# Patient Record
Sex: Male | Born: 1975 | Race: Black or African American | Hispanic: No | Marital: Single | Smoking: Current every day smoker
Health system: Southern US, Community
[De-identification: ages and names within clinical notes are randomized; demographics above are authoritative.]

## PROBLEM LIST (undated history)

## (undated) ENCOUNTER — Emergency Department (HOSPITAL_COMMUNITY): Payer: Self-pay

---

## 1998-04-15 ENCOUNTER — Emergency Department (HOSPITAL_COMMUNITY): Admission: EM | Admit: 1998-04-15 | Discharge: 1998-04-15 | Payer: Self-pay | Admitting: Emergency Medicine

## 1998-04-15 ENCOUNTER — Encounter: Payer: Self-pay | Admitting: Emergency Medicine

## 1998-09-08 ENCOUNTER — Encounter: Payer: Self-pay | Admitting: Emergency Medicine

## 1998-09-08 ENCOUNTER — Emergency Department (HOSPITAL_COMMUNITY): Admission: EM | Admit: 1998-09-08 | Discharge: 1998-09-08 | Payer: Self-pay | Admitting: Emergency Medicine

## 2002-05-14 ENCOUNTER — Emergency Department (HOSPITAL_COMMUNITY): Admission: EM | Admit: 2002-05-14 | Discharge: 2002-05-14 | Payer: Self-pay | Admitting: Emergency Medicine

## 2002-05-14 ENCOUNTER — Encounter: Payer: Self-pay | Admitting: Emergency Medicine

## 2003-11-05 ENCOUNTER — Emergency Department (HOSPITAL_COMMUNITY): Admission: EM | Admit: 2003-11-05 | Discharge: 2003-11-05 | Payer: Self-pay | Admitting: Emergency Medicine

## 2005-08-11 ENCOUNTER — Emergency Department (HOSPITAL_COMMUNITY): Admission: EM | Admit: 2005-08-11 | Discharge: 2005-08-11 | Payer: Self-pay | Admitting: Emergency Medicine

## 2008-04-20 ENCOUNTER — Emergency Department (HOSPITAL_COMMUNITY): Admission: EM | Admit: 2008-04-20 | Discharge: 2008-04-20 | Payer: Self-pay | Admitting: Emergency Medicine

## 2014-01-10 ENCOUNTER — Encounter (HOSPITAL_COMMUNITY): Payer: Self-pay | Admitting: Emergency Medicine

## 2014-01-10 ENCOUNTER — Emergency Department (HOSPITAL_COMMUNITY)
Admission: EM | Admit: 2014-01-10 | Discharge: 2014-01-10 | Disposition: A | Discharge status: Home / Self Care | Payer: BC Managed Care – PPO | Attending: Emergency Medicine | Admitting: Emergency Medicine

## 2014-01-10 ENCOUNTER — Emergency Department (HOSPITAL_COMMUNITY): Payer: BC Managed Care – PPO

## 2014-01-10 DIAGNOSIS — F17200 Nicotine dependence, unspecified, uncomplicated: Secondary | ICD-10-CM | POA: Insufficient documentation

## 2014-01-10 DIAGNOSIS — IMO0002 Reserved for concepts with insufficient information to code with codable children: Secondary | ICD-10-CM

## 2014-01-10 DIAGNOSIS — L98499 Non-pressure chronic ulcer of skin of other sites with unspecified severity: Secondary | ICD-10-CM | POA: Insufficient documentation

## 2014-01-10 MED ORDER — CEPHALEXIN 250 MG PO CAPS: 250.0000 mg | ORAL_CAPSULE | Freq: Four times a day (QID) | ORAL | Status: AC

## 2014-01-10 MED ORDER — CEPHALEXIN 250 MG PO CAPS
250.0000 mg | ORAL_CAPSULE | Freq: Once | ORAL | Status: AC
  Administered 2014-01-10: 250 mg via ORAL
  Filled 2014-01-10: qty 1

## 2014-01-10 NOTE — ED Provider Notes (Signed)
CSN: [id number]     Arrival date & time 01/10/14  2023 History  This chart was scribed for non-physician practitioner Arman FilterGail K Nikitta Sobiech, NP, working with Juliet RudeNathan R. Rubin PayorPickering, MD, by Yevette EdwardsAngela Bracken, ED Scribe. This patient was seen in room WTR6/WTR6 and the patient's care was started at 9:09 PM.   First MD Initiated Contact with Patient 01/10/14 2050     Chief Complaint  Patient presents with  . Finger Injury    HPI HPI Comments: Louretta ShortenClarence B Rosenbloom is a 38 y.o. male who presents to the Emergency Department complaining of chronic swelling to his left index finger which has persisted for approximately a year. The pt states he initially injured the finger a year ago when he accidentally punctured the finger. There was swelling associated with the initial injury three weeks later, but the pt did not seek treatment. Then, six months ago, the pt punctured the affected finger with a needle to release the pressure, and he reports drainage occurred, but he denies any bleeding from the site. The finger has persisted to experience swelling and irritation.   History reviewed. No pertinent past medical history. History reviewed. No pertinent past surgical history. History reviewed. No pertinent family history. History  Substance Use Topics  . Smoking status: Current Every Day Smoker -- 0.50 packs/day    Types: Cigarettes  . Smokeless tobacco: Not on file  . Alcohol Use: Yes    Review of Systems  Musculoskeletal: Positive for myalgias.  Skin: Positive for color change.  All other systems reviewed and are negative.   Allergies  Shellfish allergy  Home Medications   Prior to Admission medications   Not on File   There were no vitals taken for this visit. Physical Exam  Nursing note and vitals reviewed. Constitutional: He is oriented to person, place, and time. He appears well-developed and well-nourished. No distress.  HENT:  Head: Normocephalic and atraumatic.  Eyes: EOM are normal.  Neck:  Neck supple. No tracheal deviation present.  Cardiovascular: Normal rate.   Pulmonary/Chest: Effort normal. No respiratory distress.  Musculoskeletal: Normal range of motion.  Neurological: He is alert and oriented to person, place, and time.  Skin: Skin is warm and dry.  2 cm by 1 cm area on palmar surface of left index finger with a distal ulcerated open area with purulent discharge.   Psychiatric: He has a normal mood and affect. His behavior is normal.    ED Course  Procedures (including critical care time)  DIAGNOSTIC STUDIES:    COORDINATION OF CARE:  9:12 PM- Discussed treatment plan with patient, and the patient agreed to the plan. The plan includes imaging, an antibiotic, and incision and drainage.   10:11 PM- INCISION AND DRAINAGE PROCEDURE NOTE: Patient identification was confirmed and verbal consent was obtained. This procedure was performed by Arman FilterGail K Willamina Grieshop, NP,at 10:11 PM. Site: left index finger Sterile procedures observed  Needle size: 25 gauge Anesthetic used (type and amt): 2% lidocaine without epi Blade size: 11 Drainage: minimal when first squeezed, but no real drainage once opened; tissue inflammation  Complexity: Complex Site anesthetized, incision made over site, wound drained and explored loculations, rinsed with copious amounts of normal saline, wound packed with sterile gauze, covered with dry, sterile dressing.  Pt tolerated procedure well without complications.  Instructions for care discussed verbally and pt provided with additional written instructions for homecare and f/u.  10:28 PM- LACERATION REPAIR PROCEDURE NOTE The patient's identification was confirmed and consent was obtained. This procedure  was performed by Arman FilterGail K Alayssa Flinchum, NP, at 10:28 PM. Site: left index finger Sterile procedures observed Anesthetic used (type and amt): 2% lidocaine without epi Suture type/size: 3.0 Prolene Length: 1/2 cm, round  # of Sutures: 2 Technique:  Cross-hatch Complexity: complex Antibx ointment applied Site anesthetized, irrigated with NS, explored without evidence of foreign body, wound well approximated, site covered with dry, sterile dressing.  Patient tolerated procedure well without complications. Instructions for care discussed verbally and patient provided with additional written instructions for homecare and f/u.   Labs Review Labs Reviewed - No data to display  Imaging Review Dg Finger Index Left  01/10/2014   CLINICAL DATA:  Chronic injury, non healing.  EXAM: LEFT INDEX FINGER 2+V  COMPARISON:  No comparisons  FINDINGS: There is no evidence of fracture or dislocation. There is no evidence of arthropathy or other focal bone abnormality. Focal soft tissue swelling within the volar palmar soft tissues of the second middle phalanx soft tissues with subcutaneous gas, no radiopaque foreign bodies.  IMPRESSION: Focal second middle phalanx soft tissue swelling with subcutaneous gas, no radiopaque foreign bodies or radiographic findings of osteomyelitis.   Electronically Signed   By: Awilda Metroourtnay  Bloomer   On: 01/10/2014 21:55     EKG Interpretation None      MDM   Procedure very little purulent material removed .  The suture line revealed that there was chronic tissue inflammation versus abscess .  The core of the injury was removed as this was an open area, allowing air or gas to enter the finger this was removed in its entirety, sutured.Due to the  complexity and nature of the wound.  I will start patient on Keflex for 10, days.  I will also have the patient.  Followup with hand surgery sutures should be removed in 10 days Final diagnoses:  Inflammation of finger or toe          Arman FilterGail K Tedra Coppernoll, NP 01/10/14 2244

## 2014-01-10 NOTE — Discharge Instructions (Signed)
Fingertip Infection When an infection is around the nail, it is called a paronychia. When it appears over the tip of the finger, it is called a felon. These infections are due to minor injuries or cracks in the skin. If they are not treated properly, they can lead to bone infection and permanent damage to the fingernail. Incision and drainage is necessary if a pus pocket (an abscess) has formed. Antibiotics and pain medicine may also be needed. Keep your hand elevated for the next 2-3 days to reduce swelling and pain. If a pack was placed in the abscess, it should be removed in 1-2 days by your caregiver. Soak the finger in warm water for 20 minutes 4 times daily to help promote drainage. Keep the hands as dry as possible. Wear protective gloves with cotton liners. See your caregiver for follow-up care as recommended.  HOME CARE INSTRUCTIONS   Keep wound clean, dry and dressed as suggested by your caregiver.  Soak in warm salt water for fifteen minutes, four times per day for bacterial infections.  Your caregiver will prescribe an antibiotic if a bacterial infection is suspected. Take antibiotics as directed and finish the prescription, even if the problem appears to be improving before the medicine is gone.  Only take over-the-counter or prescription medicines for pain, discomfort, or fever as directed by your caregiver. SEEK IMMEDIATE MEDICAL CARE IF:  There is redness, swelling, or increasing pain in the wound.  Pus or any other unusual drainage is coming from the wound.  An unexplained oral temperature above 102 F (38.9 C) develops.  You notice a foul smell coming from the wound or dressing. MAKE SURE YOU:   Understand these instructions.  Monitor your condition.  Contact your caregiver if you are getting worse or not improving. Document Released: 09/20/2004 Document Revised: 11/05/2011 Document Reviewed: 09/16/2008 [facility] Patient Information 2014 [place] [place]. The "hard  skin" that was keeping the wound open has been removed and sutures  Please take the antibiotic until completed  You have also been referred to the hand surgeon for follow up  The sutures should be removed in 10 days

## 2014-01-10 NOTE — ED Notes (Signed)
Pt reports with c/o of injury to L index finger. Pt states that he had a previous injury about six months ago where something punctured his finger. No bleeding or deformity noted to finger. Pt alert and ambulatory to waiting room.

## 2014-01-11 NOTE — ED Provider Notes (Signed)
Medical screening examination/treatment/procedure(s) were performed by non-physician practitioner and as supervising physician I was immediately available for consultation/collaboration.   EKG Interpretation None       Malicia Blasdel R. Ray Gervasi, MD 01/11/14 0010 

## 2015-05-02 ENCOUNTER — Emergency Department (HOSPITAL_COMMUNITY): Payer: BLUE CROSS/BLUE SHIELD

## 2015-05-02 ENCOUNTER — Emergency Department (HOSPITAL_COMMUNITY)
Admission: EM | Admit: 2015-05-02 | Discharge: 2015-05-02 | Disposition: A | Discharge status: Home / Self Care | Payer: BLUE CROSS/BLUE SHIELD | Attending: Emergency Medicine | Admitting: Emergency Medicine

## 2015-05-02 ENCOUNTER — Encounter (HOSPITAL_COMMUNITY): Payer: Self-pay | Admitting: Vascular Surgery

## 2015-05-02 DIAGNOSIS — Z720 Tobacco use: Secondary | ICD-10-CM | POA: Insufficient documentation

## 2015-05-02 DIAGNOSIS — S8991XA Unspecified injury of right lower leg, initial encounter: Secondary | ICD-10-CM | POA: Insufficient documentation

## 2015-05-02 DIAGNOSIS — Y998 Other external cause status: Secondary | ICD-10-CM | POA: Insufficient documentation

## 2015-05-02 DIAGNOSIS — W1842XA Slipping, tripping and stumbling without falling due to stepping into hole or opening, initial encounter: Secondary | ICD-10-CM | POA: Diagnosis not present

## 2015-05-02 DIAGNOSIS — Y9389 Activity, other specified: Secondary | ICD-10-CM | POA: Insufficient documentation

## 2015-05-02 DIAGNOSIS — M25561 Pain in right knee: Secondary | ICD-10-CM

## 2015-05-02 DIAGNOSIS — Z792 Long term (current) use of antibiotics: Secondary | ICD-10-CM | POA: Insufficient documentation

## 2015-05-02 DIAGNOSIS — Y9289 Other specified places as the place of occurrence of the external cause: Secondary | ICD-10-CM | POA: Diagnosis not present

## 2015-05-02 MED ORDER — NAPROXEN 250 MG PO TABS
500.0000 mg | ORAL_TABLET | Freq: Once | ORAL | Status: AC
  Administered 2015-05-02: 500 mg via ORAL
  Filled 2015-05-02: qty 2

## 2015-05-02 MED ORDER — NAPROXEN 500 MG PO TABS: 500.0000 mg | ORAL_TABLET | Freq: Two times a day (BID) | ORAL | Status: AC

## 2015-05-02 NOTE — Discharge Instructions (Signed)
Knee Pain Follow up with orthopedics if you continue to have knee pain. Rest. Ice. Compress with Ace bandage. Elevate the leg. Take naproxen for pain. The knee is the complex joint between your thigh and your lower leg. It is made up of bones, tendons, ligaments, and cartilage. The bones that make up the knee are:  The femur in the thigh.  The tibia and fibula in the lower leg.  The patella or kneecap riding in the groove on the lower femur. CAUSES  Knee pain is a common complaint with many causes. A few of these causes are:  Injury, such as:  A ruptured ligament or tendon injury.  Torn cartilage.  Medical conditions, such as:  Gout  Arthritis  Infections  Overuse, over training, or overdoing a physical activity. Knee pain can be minor or severe. Knee pain can accompany debilitating injury. Minor knee problems often respond well to self-care measures or get well on their own. More serious injuries may need medical intervention or even surgery. SYMPTOMS The knee is complex. Symptoms of knee problems can vary widely. Some of the problems are:  Pain with movement and weight bearing.  Swelling and tenderness.  Buckling of the knee.  Inability to straighten or extend your knee.  Your knee locks and you cannot straighten it.  Warmth and redness with pain and fever.  Deformity or dislocation of the kneecap. DIAGNOSIS  Determining what is wrong may be very straight forward such as when there is an injury. It can also be challenging because of the complexity of the knee. Tests to make a diagnosis may include:  Your caregiver taking a history and doing a physical exam.  Routine X-rays can be used to rule out other problems. X-rays will not reveal a cartilage tear. Some injuries of the knee can be diagnosed by:  Arthroscopy a surgical technique by which a small video camera is inserted through tiny incisions on the sides of the knee. This procedure is used to examine and  repair internal knee joint problems. Tiny instruments can be used during arthroscopy to repair the torn knee cartilage (meniscus).  Arthrography is a radiology technique. A contrast liquid is directly injected into the knee joint. Internal structures of the knee joint then become visible on X-ray film.  An MRI scan is a non X-ray radiology procedure in which magnetic fields and a computer produce two- or three-dimensional images of the inside of the knee. Cartilage tears are often visible using an MRI scanner. MRI scans have largely replaced arthrography in diagnosing cartilage tears of the knee.  Blood work.  Examination of the fluid that helps to lubricate the knee joint (synovial fluid). This is done by taking a sample out using a needle and a syringe. TREATMENT The treatment of knee problems depends on the cause. Some of these treatments are:  Depending on the injury, proper casting, splinting, surgery, or physical therapy care will be needed.  Give yourself adequate recovery time. Do not overuse your joints. If you begin to get sore during workout routines, back off. Slow down or do fewer repetitions.  For repetitive activities such as cycling or running, maintain your strength and nutrition.  Alternate muscle groups. For example, if you are a weight lifter, work the upper body on one day and the lower body the next.  Either tight or weak muscles do not give the proper support for your knee. Tight or weak muscles do not absorb the stress placed on the knee joint. Keep  the muscles surrounding the knee strong.  Take care of mechanical problems.  If you have flat feet, orthotics or special shoes may help. See your caregiver if you need help.  Arch supports, sometimes with wedges on the inner or outer aspect of the heel, can help. These can shift pressure away from the side of the knee most bothered by osteoarthritis.  A brace called an "unloader" brace also may be used to help ease the  pressure on the most arthritic side of the knee.  If your caregiver has prescribed crutches, braces, wraps or ice, use as directed. The acronym for this is PRICE. This means protection, rest, ice, compression, and elevation.  Nonsteroidal anti-inflammatory drugs (NSAIDs), can help relieve pain. But if taken immediately after an injury, they may actually increase swelling. Take NSAIDs with food in your stomach. Stop them if you develop stomach problems. Do not take these if you have a history of ulcers, stomach pain, or bleeding from the bowel. Do not take without your caregiver's approval if you have problems with fluid retention, heart failure, or kidney problems.  For ongoing knee problems, physical therapy may be helpful.  Glucosamine and chondroitin are over-the-counter dietary supplements. Both may help relieve the pain of osteoarthritis in the knee. These medicines are different from the usual anti-inflammatory drugs. Glucosamine may decrease the rate of cartilage destruction.  Injections of a corticosteroid drug into your knee joint may help reduce the symptoms of an arthritis flare-up. They may provide pain relief that lasts a few months. You may have to wait a few months between injections. The injections do have a small increased risk of infection, water retention, and elevated blood sugar levels.  Hyaluronic acid injected into damaged joints may ease pain and provide lubrication. These injections may work by reducing inflammation. A series of shots may give relief for as long as 6 months.  Topical painkillers. Applying certain ointments to your skin may help relieve the pain and stiffness of osteoarthritis. Ask your pharmacist for suggestions. Many over the-counter products are approved for temporary relief of arthritis pain.  In some countries, doctors often prescribe topical NSAIDs for relief of chronic conditions such as arthritis and tendinitis. A review of treatment with NSAID creams  found that they worked as well as oral medications but without the serious side effects. PREVENTION  Maintain a healthy weight. Extra pounds put more strain on your joints.  Get strong, stay limber. Weak muscles are a common cause of knee injuries. Stretching is important. Include flexibility exercises in your workouts.  Be smart about exercise. If you have osteoarthritis, chronic knee pain or recurring injuries, you may need to change the way you exercise. This does not mean you have to stop being active. If your knees ache after jogging or playing basketball, consider switching to swimming, water aerobics, or other low-impact activities, at least for a few days a week. Sometimes limiting high-impact activities will provide relief.  Make sure your shoes fit well. Choose footwear that is right for your sport.  Protect your knees. Use the proper gear for knee-sensitive activities. Use kneepads when playing volleyball or laying carpet. Buckle your seat belt every time you drive. Most shattered kneecaps occur in car accidents.  Rest when you are tired. SEEK MEDICAL CARE IF:  You have knee pain that is continual and does not seem to be getting better.  SEEK IMMEDIATE MEDICAL CARE IF:  Your knee joint feels hot to the touch and you have a high  fever. MAKE SURE YOU:   Understand these instructions.  Will watch your condition.  Will get help right away if you are not doing well or get worse. Document Released: 06/10/2007 Document Revised: 11/05/2011 Document Reviewed: 06/10/2007 [facility] Patient Information 2015 [place], [place]. This information is not intended to replace advice given to you by your health care provider. Make sure you discuss any questions you have with your health care provider.

## 2015-05-02 NOTE — ED Provider Notes (Signed)
CSN: [id number]     Arrival date & time 05/02/15  2021 History  This chart was scribed for Catha Gosselin, PA-C, working with Rolland Porter, MD by Chestine Spore, ED Scribe. The patient was seen in room TR08C/TR08C at 9:13 PM.    Chief Complaint  Patient presents with  . Knee Pain      The history is provided by the patient. No language interpreter was used.    Jeffrey [name] is a 39 y.o. male who presents to the Emergency Department complaining of right knee pain onset yesterday. Pt reports that he stepped in a hole yesterday and he twisted his knee. Pt notes that he heard a pop noise at the time of the incident. Pt notes that today he woke up and his right knee was painful and stiff. Pt denies surgeries or prior injury to this knee in the past. Pt is having associated symptoms of gait problem due to right knee pain. He notes that he has tried RICE with no medications for the relief of his symptoms. He denies color change, wound, rash, joint swelling, and any other symptoms.    History reviewed. No pertinent past medical history. History reviewed. No pertinent past surgical history. No family history on file. Social History  Substance Use Topics  . Smoking status: Current Every Day Smoker -- 0.50 packs/day    Types: Cigarettes  . Smokeless tobacco: None  . Alcohol Use: Yes    Review of Systems  Musculoskeletal: Positive for arthralgias. Negative for joint swelling and gait problem.  Skin: Negative for color change, rash and wound.      Allergies  Shellfish allergy  Home Medications   Prior to Admission medications   Medication Sig Start Date End Date Taking? Authorizing Provider  cephALEXin (KEFLEX) 250 MG capsule Take 1 capsule (250 mg total) by mouth 4 (four) times daily. 01/10/14   Earley Favor, NP  naproxen (NAPROSYN) 500 MG tablet Take 1 tablet (500 mg total) by mouth 2 (two) times daily. 05/02/15   Kidada Ging Patel-Mills, PA-C   BP 120/80 mmHg  Pulse 98  Temp(Src) 98 F  (36.7 C) (Oral)  Resp 15  SpO2 98% Physical Exam  Constitutional: He is oriented to person, place, and time. He appears well-developed and well-nourished. No distress.  HENT:  Head: Normocephalic and atraumatic.  Eyes: EOM are normal.  Neck: Neck supple. No tracheal deviation present.  Cardiovascular: Normal rate.   Pulmonary/Chest: Effort normal. No respiratory distress.  Musculoskeletal: Normal range of motion.       Right knee: He exhibits no effusion and no deformity. Tenderness found. Medial joint line tenderness noted.  Right knee: Medial TTP. Pain with flexion of the knee. No patellar or fibular head tenderness. Able to extend the knee and SLR. No joint effusion or deformity. Pt is ambulatory.  Neurological: He is alert and oriented to person, place, and time.  Skin: Skin is warm and dry.  Psychiatric: He has a normal mood and affect. His behavior is normal.  Nursing note and vitals reviewed.   ED Course  Procedures (including critical care time) DIAGNOSTIC STUDIES: Oxygen Saturation is 96% on RA, nl by my interpretation.    COORDINATION OF CARE: 9:15 PM Discussed treatment plan with pt at bedside and pt agreed to plan.   Labs Review Labs Reviewed - No data to display  Imaging Review Dg Knee Complete 4 Views Right  05/02/2015   CLINICAL DATA:  Patient stepped off of porch last night twisting the  right knee. Medial right knee pain. Pain with weight-bearing.  EXAM: RIGHT KNEE - COMPLETE 4+ VIEW  COMPARISON:  None.  FINDINGS: There is no evidence of fracture, dislocation, or joint effusion. There is no evidence of arthropathy or other focal bone abnormality. Soft tissues are unremarkable.  IMPRESSION: Negative.   Electronically Signed   By: Burman Nieves M.D.   On: 05/02/2015 21:55   Catha Gosselin, PA-C has personally reviewed and evaluated these image results as part of my medical decision-making.   EKG Interpretation None      MDM   Final diagnoses:  Right  knee pain   Patient presents for right knee pain after stepping into a hole and feeling a pop in his knee. X-ray of the knee is negative for acute fracture or dislocation. I do not suspect quadriceps tendon rupture. Patient is ambulatory. Medications  naproxen (NAPROSYN) tablet 500 mg (500 mg Oral Given 05/02/15 2123)  I discussed our RICE. He was given an Ace bandage. I also gave him orthopedic referral and discussed return precautions. Patient verbally agrees with the plan. I personally performed the services described in this documentation, which was scribed in my presence. The recorded information has been reviewed and is accurate.    Catha Gosselin, PA-C 05/02/15 2305  Rolland Porter, MD 05/10/15 [phone]

## 2015-05-02 NOTE — ED Notes (Signed)
Pt reports to the ED for eval of right knee pain. Stepped in a hole and twisted his knee yesterday and reports today when he woke up his knee was painful and stiff. No obvious swelling or deformity noted. Pt is bearing weight. Pt A&Ox4, resp e/u, and skin warm and dry.

## 2017-06-05 ENCOUNTER — Emergency Department (HOSPITAL_COMMUNITY)
Admission: EM | Admit: 2017-06-05 | Discharge: 2017-06-05 | Disposition: A | Discharge status: Home / Self Care | Payer: No Typology Code available for payment source | Attending: Emergency Medicine | Admitting: Emergency Medicine

## 2017-06-05 DIAGNOSIS — S199XXA Unspecified injury of neck, initial encounter: Secondary | ICD-10-CM | POA: Insufficient documentation

## 2017-06-05 DIAGNOSIS — T148XXA Other injury of unspecified body region, initial encounter: Secondary | ICD-10-CM

## 2017-06-05 DIAGNOSIS — F17210 Nicotine dependence, cigarettes, uncomplicated: Secondary | ICD-10-CM | POA: Diagnosis not present

## 2017-06-05 DIAGNOSIS — Y998 Other external cause status: Secondary | ICD-10-CM | POA: Diagnosis not present

## 2017-06-05 DIAGNOSIS — S8991XA Unspecified injury of right lower leg, initial encounter: Secondary | ICD-10-CM | POA: Diagnosis not present

## 2017-06-05 DIAGNOSIS — Y92410 Unspecified street and highway as the place of occurrence of the external cause: Secondary | ICD-10-CM | POA: Diagnosis not present

## 2017-06-05 DIAGNOSIS — Y939 Activity, unspecified: Secondary | ICD-10-CM | POA: Diagnosis not present

## 2017-06-05 MED ORDER — IBUPROFEN 200 MG PO TABS
400.0000 mg | ORAL_TABLET | Freq: Once | ORAL | Status: AC
  Administered 2017-06-05: 400 mg via ORAL
  Filled 2017-06-05: qty 2
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
## 2017-06-13 ENCOUNTER — Encounter (HOSPITAL_BASED_OUTPATIENT_CLINIC_OR_DEPARTMENT_OTHER): Payer: Self-pay | Admitting: Emergency Medicine

## 2017-06-13 DIAGNOSIS — J438 Other emphysema: Secondary | ICD-10-CM | POA: Diagnosis not present

## 2017-06-13 DIAGNOSIS — Y9389 Activity, other specified: Secondary | ICD-10-CM | POA: Diagnosis not present

## 2017-06-13 DIAGNOSIS — Y92410 Unspecified street and highway as the place of occurrence of the external cause: Secondary | ICD-10-CM | POA: Diagnosis not present

## 2017-06-13 DIAGNOSIS — F17210 Nicotine dependence, cigarettes, uncomplicated: Secondary | ICD-10-CM | POA: Insufficient documentation

## 2017-06-13 DIAGNOSIS — S72431A Displaced fracture of medial condyle of right femur, initial encounter for closed fracture: Secondary | ICD-10-CM | POA: Diagnosis not present

## 2017-06-13 DIAGNOSIS — Z79899 Other long term (current) drug therapy: Secondary | ICD-10-CM | POA: Diagnosis not present

## 2017-06-13 DIAGNOSIS — Y998 Other external cause status: Secondary | ICD-10-CM | POA: Insufficient documentation

## 2017-06-13 DIAGNOSIS — S8991XA Unspecified injury of right lower leg, initial encounter: Secondary | ICD-10-CM | POA: Diagnosis present
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
## 2017-06-14 ENCOUNTER — Emergency Department (HOSPITAL_BASED_OUTPATIENT_CLINIC_OR_DEPARTMENT_OTHER): Payer: No Typology Code available for payment source

## 2017-06-14 ENCOUNTER — Emergency Department (HOSPITAL_BASED_OUTPATIENT_CLINIC_OR_DEPARTMENT_OTHER)
Admission: EM | Admit: 2017-06-14 | Discharge: 2017-06-14 | Disposition: A | Discharge status: Home / Self Care | Payer: No Typology Code available for payment source | Attending: Emergency Medicine | Admitting: Emergency Medicine

## 2017-06-14 ENCOUNTER — Encounter (HOSPITAL_BASED_OUTPATIENT_CLINIC_OR_DEPARTMENT_OTHER): Payer: Self-pay | Admitting: Emergency Medicine

## 2017-06-14 DIAGNOSIS — J438 Other emphysema: Secondary | ICD-10-CM

## 2017-06-14 DIAGNOSIS — S72411A Displaced unspecified condyle fracture of lower end of right femur, initial encounter for closed fracture: Secondary | ICD-10-CM

## 2017-06-14 MED ORDER — NAPROXEN 250 MG PO TABS
500.0000 mg | ORAL_TABLET | Freq: Once | ORAL | Status: AC
  Administered 2017-06-14: 500 mg via ORAL
  Filled 2017-06-14: qty 2

## 2017-06-14 MED ORDER — ACETAMINOPHEN 500 MG PO TABS
1000.0000 mg | ORAL_TABLET | Freq: Once | ORAL | Status: AC
  Administered 2017-06-14: 1000 mg via ORAL
  Filled 2017-06-14: qty 2

## 2017-06-14 MED ORDER — DICLOFENAC SODIUM ER 100 MG PO TB24: 100.0000 mg | ORAL_TABLET | Freq: Every day | ORAL | 0 refills | Status: AC
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
## 2023-05-18 ENCOUNTER — Other Ambulatory Visit: Payer: Self-pay

## 2023-05-18 ENCOUNTER — Emergency Department (HOSPITAL_COMMUNITY): Payer: BC Managed Care – PPO | Admitting: Anesthesiology

## 2023-05-18 ENCOUNTER — Inpatient Hospital Stay (HOSPITAL_COMMUNITY): Payer: BC Managed Care – PPO

## 2023-05-18 ENCOUNTER — Encounter (HOSPITAL_COMMUNITY): Admission: EM | Disposition: A | Discharge status: Home / Self Care | Payer: Self-pay | Source: Home / Self Care

## 2023-05-18 ENCOUNTER — Inpatient Hospital Stay (HOSPITAL_COMMUNITY)
Admission: EM | Admit: 2023-05-18 | Discharge: 2023-05-26 | DRG: 957 | Disposition: A | Discharge status: Home / Self Care | Payer: BC Managed Care – PPO

## 2023-05-18 ENCOUNTER — Emergency Department (HOSPITAL_COMMUNITY): Payer: BC Managed Care – PPO

## 2023-05-18 DIAGNOSIS — W3400XA Accidental discharge from unspecified firearms or gun, initial encounter: Secondary | ICD-10-CM

## 2023-05-18 DIAGNOSIS — J942 Hemothorax: Secondary | ICD-10-CM

## 2023-05-18 DIAGNOSIS — S21339A Puncture wound without foreign body of unspecified front wall of thorax with penetration into thoracic cavity, initial encounter: Principal | ICD-10-CM

## 2023-05-18 DIAGNOSIS — T1490XA Injury, unspecified, initial encounter: Secondary | ICD-10-CM | POA: Diagnosis present

## 2023-05-18 DIAGNOSIS — J9601 Acute respiratory failure with hypoxia: Secondary | ICD-10-CM | POA: Diagnosis present

## 2023-05-18 DIAGNOSIS — Y249XXA Unspecified firearm discharge, undetermined intent, initial encounter: Secondary | ICD-10-CM

## 2023-05-18 DIAGNOSIS — D62 Acute posthemorrhagic anemia: Secondary | ICD-10-CM | POA: Diagnosis present

## 2023-05-18 DIAGNOSIS — K567 Ileus, unspecified: Secondary | ICD-10-CM | POA: Diagnosis not present

## 2023-05-18 DIAGNOSIS — F4310 Post-traumatic stress disorder, unspecified: Secondary | ICD-10-CM | POA: Diagnosis present

## 2023-05-18 DIAGNOSIS — F1210 Cannabis abuse, uncomplicated: Secondary | ICD-10-CM | POA: Diagnosis present

## 2023-05-18 DIAGNOSIS — S36113A Laceration of liver, unspecified degree, initial encounter: Secondary | ICD-10-CM | POA: Diagnosis present

## 2023-05-18 DIAGNOSIS — I959 Hypotension, unspecified: Secondary | ICD-10-CM | POA: Diagnosis present

## 2023-05-18 DIAGNOSIS — S272XXA Traumatic hemopneumothorax, initial encounter: Secondary | ICD-10-CM | POA: Diagnosis present

## 2023-05-18 DIAGNOSIS — Z23 Encounter for immunization: Secondary | ICD-10-CM | POA: Diagnosis not present

## 2023-05-18 DIAGNOSIS — F1010 Alcohol abuse, uncomplicated: Secondary | ICD-10-CM | POA: Diagnosis present

## 2023-05-18 DIAGNOSIS — M62838 Other muscle spasm: Secondary | ICD-10-CM | POA: Diagnosis not present

## 2023-05-18 DIAGNOSIS — F430 Acute stress reaction: Secondary | ICD-10-CM | POA: Diagnosis present

## 2023-05-18 DIAGNOSIS — S21331A Puncture wound without foreign body of right front wall of thorax with penetration into thoracic cavity, initial encounter: Principal | ICD-10-CM | POA: Diagnosis present

## 2023-05-18 DIAGNOSIS — S21102A Unspecified open wound of left front wall of thorax without penetration into thoracic cavity, initial encounter: Secondary | ICD-10-CM | POA: Diagnosis not present

## 2023-05-18 DIAGNOSIS — K661 Hemoperitoneum: Secondary | ICD-10-CM | POA: Diagnosis not present

## 2023-05-18 DIAGNOSIS — Y92009 Unspecified place in unspecified non-institutional (private) residence as the place of occurrence of the external cause: Secondary | ICD-10-CM | POA: Diagnosis not present

## 2023-05-18 DIAGNOSIS — I10 Essential (primary) hypertension: Secondary | ICD-10-CM | POA: Diagnosis not present

## 2023-05-18 DIAGNOSIS — G479 Sleep disorder, unspecified: Secondary | ICD-10-CM | POA: Diagnosis present

## 2023-05-18 DIAGNOSIS — F17210 Nicotine dependence, cigarettes, uncomplicated: Secondary | ICD-10-CM | POA: Diagnosis present

## 2023-05-18 DIAGNOSIS — S29001A Unspecified injury of muscle and tendon of front wall of thorax, initial encounter: Secondary | ICD-10-CM | POA: Diagnosis not present

## 2023-05-18 DIAGNOSIS — S27809A Unspecified injury of diaphragm, initial encounter: Secondary | ICD-10-CM | POA: Diagnosis not present

## 2023-05-18 DIAGNOSIS — S36114A Minor laceration of liver, initial encounter: Secondary | ICD-10-CM | POA: Diagnosis not present

## 2023-05-18 DIAGNOSIS — S271XXA Traumatic hemothorax, initial encounter: Secondary | ICD-10-CM | POA: Diagnosis not present

## 2023-05-18 DIAGNOSIS — S27808A Other injury of diaphragm, initial encounter: Secondary | ICD-10-CM | POA: Diagnosis not present

## 2023-05-18 HISTORY — PX: LAPAROSCOPY: SHX197

## 2023-05-18 HISTORY — PX: CHEST TUBE INSERTION: SHX231

## 2023-05-18 LAB — POCT I-STAT 7, (LYTES, BLD GAS, ICA,H+H)
Acid-base deficit: 11 mmol/L — ABNORMAL HIGH (ref 0.0–2.0)
Acid-base deficit: 9 mmol/L — ABNORMAL HIGH (ref 0.0–2.0)
Bicarbonate: 16.7 mmol/L — ABNORMAL LOW (ref 20.0–28.0)
Bicarbonate: 17.9 mmol/L — ABNORMAL LOW (ref 20.0–28.0)
Calcium, Ion: 0.91 mmol/L — ABNORMAL LOW (ref 1.15–1.40)
Calcium, Ion: 1.04 mmol/L — ABNORMAL LOW (ref 1.15–1.40)
HCT: 31 % — ABNORMAL LOW (ref 39.0–52.0)
HCT: 38 % — ABNORMAL LOW (ref 39.0–52.0)
Hemoglobin: 10.5 g/dL — ABNORMAL LOW (ref 13.0–17.0)
Hemoglobin: 12.9 g/dL — ABNORMAL LOW (ref 13.0–17.0)
O2 Saturation: 91 %
O2 Saturation: 97 %
Patient temperature: 35.6
Potassium: 3.5 mmol/L (ref 3.5–5.1)
Potassium: 3.9 mmol/L (ref 3.5–5.1)
Sodium: 146 mmol/L — ABNORMAL HIGH (ref 135–145)
Sodium: 147 mmol/L — ABNORMAL HIGH (ref 135–145)
TCO2: 18 mmol/L — ABNORMAL LOW (ref 22–32)
TCO2: 19 mmol/L — ABNORMAL LOW (ref 22–32)
pCO2 arterial: 31.2 mmHg — ABNORMAL LOW (ref 32–48)
pCO2 arterial: 49.5 mmHg — ABNORMAL HIGH (ref 32–48)
pH, Arterial: 7.165 — CL (ref 7.35–7.45)
pH, Arterial: 7.33 — ABNORMAL LOW (ref 7.35–7.45)
pO2, Arterial: 114 mmHg — ABNORMAL HIGH (ref 83–108)
pO2, Arterial: 60 mmHg — ABNORMAL LOW (ref 83–108)

## 2023-05-18 LAB — URINALYSIS, ROUTINE W REFLEX MICROSCOPIC
Bilirubin Urine: NEGATIVE
Glucose, UA: NEGATIVE mg/dL
Ketones, ur: 5 mg/dL — AB
Leukocytes,Ua: NEGATIVE
Nitrite: NEGATIVE
Protein, ur: NEGATIVE mg/dL
Specific Gravity, Urine: 1.013 (ref 1.005–1.030)
pH: 5 (ref 5.0–8.0)

## 2023-05-18 LAB — COMPREHENSIVE METABOLIC PANEL
ALT: 69 U/L — ABNORMAL HIGH (ref 0–44)
ALT: 107 U/L — ABNORMAL HIGH (ref 0–44)
AST: 113 U/L — ABNORMAL HIGH (ref 15–41)
AST: 193 U/L — ABNORMAL HIGH (ref 15–41)
Albumin: 3.5 g/dL (ref 3.5–5.0)
Albumin: 3.3 g/dL — ABNORMAL LOW (ref 3.5–5.0)
Alkaline Phosphatase: 57 U/L (ref 38–126)
Alkaline Phosphatase: 52 U/L (ref 38–126)
Anion gap: 13 (ref 5–15)
Anion gap: 14 (ref 5–15)
BUN: 12 mg/dL (ref 6–20)
BUN: 10 mg/dL (ref 6–20)
CO2: 19 mmol/L — ABNORMAL LOW (ref 22–32)
CO2: 18 mmol/L — ABNORMAL LOW (ref 22–32)
Calcium: 7.3 mg/dL — ABNORMAL LOW (ref 8.9–10.3)
Calcium: 6.7 mg/dL — ABNORMAL LOW (ref 8.9–10.3)
Chloride: 110 mmol/L (ref 98–111)
Chloride: 112 mmol/L — ABNORMAL HIGH (ref 98–111)
Creatinine, Ser: 1.14 mg/dL (ref 0.61–1.24)
Creatinine, Ser: 0.78 mg/dL (ref 0.61–1.24)
GFR, Estimated: >60 mL/min (ref >60)
GFR, Estimated: >60 mL/min (ref >60)
Glucose, Bld: 76 mg/dL (ref 70–99)
Glucose, Bld: 92 mg/dL (ref 70–99)
Potassium: 3.5 mmol/L (ref 3.5–5.1)
Potassium: 3.5 mmol/L (ref 3.5–5.1)
Sodium: 142 mmol/L (ref 135–145)
Sodium: 144 mmol/L (ref 135–145)
Total Bilirubin: 0.4 mg/dL (ref 0.3–1.2)
Total Bilirubin: 1.2 mg/dL (ref 0.3–1.2)
Total Protein: 5.6 g/dL — ABNORMAL LOW (ref 6.5–8.1)
Total Protein: 5.3 g/dL — ABNORMAL LOW (ref 6.5–8.1)

## 2023-05-18 LAB — CBC
HCT: 36.5 % — ABNORMAL LOW (ref 39.0–52.0)
Hemoglobin: 12.1 g/dL — ABNORMAL LOW (ref 13.0–17.0)
MCH: 29.8 pg (ref 26.0–34.0)
MCHC: 33.2 g/dL (ref 30.0–36.0)
MCV: 89.9 fL (ref 80.0–100.0)
Platelets: 175 10*3/uL (ref 150–400)
RBC: 4.06 MIL/uL — ABNORMAL LOW (ref 4.22–5.81)
RDW: 15.2 % (ref 11.5–15.5)
WBC: 14.6 10*3/uL — ABNORMAL HIGH (ref 4.0–10.5)
nRBC: 0 % (ref 0.0–0.2)

## 2023-05-18 LAB — CBC WITH DIFFERENTIAL/PLATELET
Abs Immature Granulocytes: 0.1 10*3/uL — ABNORMAL HIGH (ref 0.00–0.07)
Basophils Absolute: 0 10*3/uL (ref 0.0–0.1)
Basophils Relative: 0 %
Eosinophils Absolute: 0 10*3/uL (ref 0.0–0.5)
Eosinophils Relative: 0 %
HCT: 42 % (ref 39.0–52.0)
Hemoglobin: 13.6 g/dL (ref 13.0–17.0)
Immature Granulocytes: 1 %
Lymphocytes Relative: 28 %
Lymphs Abs: 3.2 10*3/uL (ref 0.7–4.0)
MCH: 30.2 pg (ref 26.0–34.0)
MCHC: 32.4 g/dL (ref 30.0–36.0)
MCV: 93.3 fL (ref 80.0–100.0)
Monocytes Absolute: 0.9 10*3/uL (ref 0.1–1.0)
Monocytes Relative: 8 %
Neutro Abs: 7.2 10*3/uL (ref 1.7–7.7)
Neutrophils Relative %: 63 %
Platelets: 232 10*3/uL (ref 150–400)
RBC: 4.5 MIL/uL (ref 4.22–5.81)
RDW: 15 % (ref 11.5–15.5)
WBC: 11.5 10*3/uL — ABNORMAL HIGH (ref 4.0–10.5)
nRBC: 0 % (ref 0.0–0.2)

## 2023-05-18 LAB — ETHANOL: Alcohol, Ethyl (B): 364 mg/dL (ref <10)

## 2023-05-18 LAB — RAPID URINE DRUG SCREEN, HOSP PERFORMED
Amphetamines: NOT DETECTED
Barbiturates: NOT DETECTED
Benzodiazepines: POSITIVE — AB
Cocaine: NOT DETECTED
Opiates: POSITIVE — AB
Tetrahydrocannabinol: POSITIVE — AB

## 2023-05-18 LAB — MRSA NEXT GEN BY PCR, NASAL: MRSA by PCR Next Gen: NOT DETECTED

## 2023-05-18 LAB — PROTIME-INR
INR: 1.1 (ref 0.8–1.2)
Prothrombin Time: 14.7 seconds (ref 11.4–15.2)

## 2023-05-18 LAB — PREPARE RBC (CROSSMATCH)

## 2023-05-18 LAB — HIV ANTIBODY (ROUTINE TESTING W REFLEX): HIV Screen 4th Generation wRfx: NONREACTIVE

## 2023-05-18 LAB — ABO/RH: ABO/RH(D): O POS

## 2023-05-18 SURGERY — LAPAROSCOPY, DIAGNOSTIC
Anesthesia: General | Laterality: Right

## 2023-05-18 MED ORDER — POLYETHYLENE GLYCOL 3350 17 G PO PACK: 17.0000 g | PACK | Freq: Every day | ORAL | Status: DC | PRN

## 2023-05-18 MED ORDER — FENTANYL CITRATE PF 50 MCG/ML IJ SOSY
50.0000 ug | PREFILLED_SYRINGE | Freq: Once | INTRAMUSCULAR | Status: AC
  Administered 2023-05-18: 50 ug via INTRAVENOUS

## 2023-05-18 MED ORDER — FENTANYL CITRATE PF 50 MCG/ML IJ SOSY
PREFILLED_SYRINGE | INTRAMUSCULAR | Status: AC
  Filled 2023-05-18: qty 1

## 2023-05-18 MED ORDER — FENTANYL CITRATE (PF) 250 MCG/5ML IJ SOLN
INTRAMUSCULAR | Status: AC
  Filled 2023-05-18: qty 5

## 2023-05-18 MED ORDER — DOCUSATE SODIUM 50 MG/5ML PO LIQD: 100.0000 mg | Freq: Two times a day (BID) | ORAL | Status: DC

## 2023-05-18 MED ORDER — CHLORHEXIDINE GLUCONATE CLOTH 2 % EX PADS
6.0000 | MEDICATED_PAD | Freq: Every day | CUTANEOUS | Status: DC
  Administered 2023-05-19 – 2023-05-26 (×8): 6 via TOPICAL

## 2023-05-18 MED ORDER — 0.9 % SODIUM CHLORIDE (POUR BTL) OPTIME
TOPICAL | Status: DC | PRN
  Administered 2023-05-18: 3000 mL

## 2023-05-18 MED ORDER — ONDANSETRON HCL 4 MG/2ML IJ SOLN
INTRAMUSCULAR | Status: AC
  Filled 2023-05-18: qty 6

## 2023-05-18 MED ORDER — ORAL CARE MOUTH RINSE
15.0000 mL | OROMUCOSAL | Status: DC
  Administered 2023-05-18 – 2023-05-19 (×12): 15 mL via OROMUCOSAL

## 2023-05-18 MED ORDER — MORPHINE SULFATE (PF) 2 MG/ML IV SOLN: 2.0000 mg | INTRAVENOUS | Status: DC | PRN

## 2023-05-18 MED ORDER — ONDANSETRON HCL 4 MG/2ML IJ SOLN
4.0000 mg | Freq: Four times a day (QID) | INTRAMUSCULAR | Status: DC | PRN
  Administered 2023-05-19 – 2023-05-24 (×3): 4 mg via INTRAVENOUS
  Filled 2023-05-18 (×3): qty 2

## 2023-05-18 MED ORDER — ARTIFICIAL TEARS OPHTHALMIC OINT
TOPICAL_OINTMENT | OPHTHALMIC | Status: AC
  Filled 2023-05-18: qty 7

## 2023-05-18 MED ORDER — HYDRALAZINE HCL 20 MG/ML IJ SOLN
10.0000 mg | INTRAMUSCULAR | Status: DC | PRN
  Administered 2023-05-19: 10 mg via INTRAVENOUS
  Filled 2023-05-18: qty 1

## 2023-05-18 MED ORDER — MIDAZOLAM HCL 2 MG/2ML IJ SOLN
INTRAMUSCULAR | Status: DC | PRN
  Administered 2023-05-18: 2 mg via INTRAVENOUS

## 2023-05-18 MED ORDER — POLYETHYLENE GLYCOL 3350 17 G PO PACK: 17.0000 g | PACK | Freq: Every day | ORAL | Status: DC

## 2023-05-18 MED ORDER — ROCURONIUM BROMIDE 10 MG/ML (PF) SYRINGE
PREFILLED_SYRINGE | INTRAVENOUS | Status: DC | PRN
  Administered 2023-05-18: 100 mg via INTRAVENOUS
  Administered 2023-05-18: 50 mg via INTRAVENOUS

## 2023-05-18 MED ORDER — FENTANYL 2500MCG IN NS 250ML (10MCG/ML) PREMIX INFUSION
50.0000 ug/h | INTRAVENOUS | Status: DC
  Administered 2023-05-18: 75 ug/h via INTRAVENOUS
  Administered 2023-05-19: 150 ug/h via INTRAVENOUS
  Filled 2023-05-18: qty 250

## 2023-05-18 MED ORDER — DOCUSATE SODIUM 50 MG/5ML PO LIQD
100.0000 mg | Freq: Two times a day (BID) | ORAL | Status: DC
  Administered 2023-05-18 – 2023-05-19 (×2): 100 mg
  Filled 2023-05-18 (×3): qty 10

## 2023-05-18 MED ORDER — DOCUSATE SODIUM 100 MG PO CAPS: 100.0000 mg | ORAL_CAPSULE | Freq: Two times a day (BID) | ORAL | Status: DC

## 2023-05-18 MED ORDER — PHENYLEPHRINE HCL-NACL 20-0.9 MG/250ML-% IV SOLN
INTRAVENOUS | Status: DC | PRN
  Administered 2023-05-18: 30 ug/min via INTRAVENOUS

## 2023-05-18 MED ORDER — VASOPRESSIN 20 UNIT/ML IV SOLN
INTRAVENOUS | Status: AC
  Filled 2023-05-18: qty 1

## 2023-05-18 MED ORDER — FENTANYL CITRATE PF 50 MCG/ML IJ SOSY
50.0000 ug | PREFILLED_SYRINGE | INTRAMUSCULAR | Status: DC | PRN
  Administered 2023-05-18: 50 ug via INTRAVENOUS
  Filled 2023-05-18: qty 1

## 2023-05-18 MED ORDER — LACTATED RINGERS IV SOLN: INTRAVENOUS | Status: DC

## 2023-05-18 MED ORDER — METHOCARBAMOL 1000 MG/10ML IJ SOLN
500.0000 mg | Freq: Three times a day (TID) | INTRAVENOUS | Status: DC
  Filled 2023-05-18: qty 5

## 2023-05-18 MED ORDER — FENTANYL CITRATE PF 50 MCG/ML IJ SOSY
50.0000 ug | PREFILLED_SYRINGE | INTRAMUSCULAR | Status: DC | PRN
  Administered 2023-05-19 (×2): 100 ug via INTRAVENOUS

## 2023-05-18 MED ORDER — MIDAZOLAM HCL 2 MG/2ML IJ SOLN
1.0000 mg | INTRAMUSCULAR | Status: DC | PRN
  Administered 2023-05-18 (×2): 2 mg via INTRAVENOUS
  Filled 2023-05-18: qty 2

## 2023-05-18 MED ORDER — ACETAMINOPHEN 500 MG PO TABS
1000.0000 mg | ORAL_TABLET | Freq: Four times a day (QID) | ORAL | Status: DC
  Administered 2023-05-18 – 2023-05-19 (×4): 1000 mg
  Filled 2023-05-18 (×4): qty 2

## 2023-05-18 MED ORDER — ROCURONIUM BROMIDE 10 MG/ML (PF) SYRINGE
PREFILLED_SYRINGE | INTRAVENOUS | Status: AC
  Filled 2023-05-18: qty 30

## 2023-05-18 MED ORDER — ONDANSETRON HCL 4 MG/2ML IJ SOLN
4.0000 mg | Freq: Once | INTRAMUSCULAR | Status: AC
  Administered 2023-05-18: 4 mg via INTRAVENOUS

## 2023-05-18 MED ORDER — FENTANYL CITRATE (PF) 250 MCG/5ML IJ SOLN
INTRAMUSCULAR | Status: DC | PRN
  Administered 2023-05-18 (×2): 75 ug via INTRAVENOUS
  Administered 2023-05-18: 100 ug via INTRAVENOUS

## 2023-05-18 MED ORDER — METHOCARBAMOL 1000 MG/10ML IJ SOLN
500.0000 mg | Freq: Three times a day (TID) | INTRAVENOUS | Status: DC
  Administered 2023-05-18 – 2023-05-19 (×2): 500 mg via INTRAVENOUS
  Filled 2023-05-18: qty 500

## 2023-05-18 MED ORDER — FENTANYL BOLUS VIA INFUSION: 50.0000 ug | INTRAVENOUS | Status: DC | PRN

## 2023-05-18 MED ORDER — SUCCINYLCHOLINE CHLORIDE 200 MG/10ML IV SOSY
PREFILLED_SYRINGE | INTRAVENOUS | Status: DC | PRN
  Administered 2023-05-18: 140 mg via INTRAVENOUS

## 2023-05-18 MED ORDER — PANTOPRAZOLE SODIUM 40 MG PO TBEC
40.0000 mg | DELAYED_RELEASE_TABLET | Freq: Every day | ORAL | Status: DC
  Administered 2023-05-20 – 2023-05-21 (×2): 40 mg via ORAL
  Filled 2023-05-18 (×3): qty 1

## 2023-05-18 MED ORDER — ACETAMINOPHEN 500 MG PO TABS: 1000.0000 mg | ORAL_TABLET | Freq: Four times a day (QID) | ORAL | Status: DC

## 2023-05-18 MED ORDER — PROPOFOL 1000 MG/100ML IV EMUL
0.0000 ug/kg/min | INTRAVENOUS | Status: DC
  Administered 2023-05-18: 20 ug/kg/min via INTRAVENOUS
  Administered 2023-05-18 – 2023-05-19 (×2): 30 ug/kg/min via INTRAVENOUS
  Filled 2023-05-18 (×2): qty 100

## 2023-05-18 MED ORDER — MORPHINE SULFATE (PF) 2 MG/ML IV SOLN: 1.0000 mg | INTRAVENOUS | Status: DC | PRN

## 2023-05-18 MED ORDER — ORAL CARE MOUTH RINSE: 15.0000 mL | OROMUCOSAL | Status: DC | PRN

## 2023-05-18 MED ORDER — MIDAZOLAM HCL 2 MG/2ML IJ SOLN
INTRAMUSCULAR | Status: AC
  Filled 2023-05-18: qty 2

## 2023-05-18 MED ORDER — METHOCARBAMOL 500 MG PO TABS: 500.0000 mg | ORAL_TABLET | Freq: Three times a day (TID) | ORAL | Status: DC

## 2023-05-18 MED ORDER — CEFAZOLIN SODIUM 1 G IJ SOLR
INTRAMUSCULAR | Status: AC
  Filled 2023-05-18: qty 20

## 2023-05-18 MED ORDER — FENTANYL 2500MCG IN NS 250ML (10MCG/ML) PREMIX INFUSION
INTRAVENOUS | Status: AC
  Filled 2023-05-18: qty 250

## 2023-05-18 MED ORDER — LACTATED RINGERS IV SOLN: Freq: Once | INTRAVENOUS | Status: AC

## 2023-05-18 MED ORDER — METOPROLOL TARTRATE 5 MG/5ML IV SOLN: 5.0000 mg | Freq: Four times a day (QID) | INTRAVENOUS | Status: DC | PRN

## 2023-05-18 MED ORDER — TETANUS-DIPHTH-ACELL PERTUSSIS 5-2.5-18.5 LF-MCG/0.5 IM SUSY
0.5000 mL | PREFILLED_SYRINGE | Freq: Once | INTRAMUSCULAR | Status: AC
  Administered 2023-05-18: 0.5 mL via INTRAMUSCULAR

## 2023-05-18 MED ORDER — ONDANSETRON HCL 4 MG/2ML IJ SOLN
INTRAMUSCULAR | Status: AC
  Filled 2023-05-18: qty 2

## 2023-05-18 MED ORDER — IOHEXOL 350 MG/ML SOLN
75.0000 mL | Freq: Once | INTRAVENOUS | Status: AC | PRN
  Administered 2023-05-18: 75 mL via INTRAVENOUS

## 2023-05-18 MED ORDER — PIPERACILLIN-TAZOBACTAM 3.375 G IVPB
3.3750 g | Freq: Three times a day (TID) | INTRAVENOUS | Status: DC
  Administered 2023-05-18 – 2023-05-21 (×9): 3.375 g via INTRAVENOUS
  Filled 2023-05-18 (×10): qty 50

## 2023-05-18 MED ORDER — EPHEDRINE 5 MG/ML INJ
INTRAVENOUS | Status: AC
  Filled 2023-05-18: qty 20

## 2023-05-18 MED ORDER — ONDANSETRON 4 MG PO TBDP: 4.0000 mg | ORAL_TABLET | Freq: Four times a day (QID) | ORAL | Status: DC | PRN

## 2023-05-18 MED ORDER — SODIUM CHLORIDE 0.9% IV SOLUTION: Freq: Once | INTRAVENOUS | Status: DC

## 2023-05-18 MED ORDER — PANTOPRAZOLE SODIUM 40 MG IV SOLR
40.0000 mg | Freq: Every day | INTRAVENOUS | Status: DC
  Administered 2023-05-18 – 2023-05-19 (×2): 40 mg via INTRAVENOUS
  Filled 2023-05-18 (×2): qty 10

## 2023-05-18 MED ORDER — SUCCINYLCHOLINE CHLORIDE 200 MG/10ML IV SOSY
PREFILLED_SYRINGE | INTRAVENOUS | Status: AC
  Filled 2023-05-18: qty 30

## 2023-05-18 MED ORDER — POLYETHYLENE GLYCOL 3350 17 G PO PACK
17.0000 g | PACK | Freq: Every day | ORAL | Status: DC
  Administered 2023-05-19: 17 g
  Filled 2023-05-18: qty 1

## 2023-05-18 MED ORDER — DEXAMETHASONE SODIUM PHOSPHATE 10 MG/ML IJ SOLN
INTRAMUSCULAR | Status: AC
  Filled 2023-05-18: qty 3

## 2023-05-18 MED ORDER — METHOCARBAMOL 500 MG PO TABS
500.0000 mg | ORAL_TABLET | Freq: Three times a day (TID) | ORAL | Status: DC
  Administered 2023-05-19: 500 mg
  Filled 2023-05-18: qty 1

## 2023-05-18 MED ORDER — MORPHINE SULFATE (PF) 4 MG/ML IV SOLN
4.0000 mg | INTRAVENOUS | Status: DC | PRN
  Administered 2023-05-18: 4 mg via INTRAVENOUS
  Filled 2023-05-18: qty 1

## 2023-05-18 MED ORDER — LIDOCAINE 2% (20 MG/ML) 5 ML SYRINGE
INTRAMUSCULAR | Status: AC
  Filled 2023-05-18: qty 15

## 2023-05-18 MED ORDER — PHENYLEPHRINE 80 MCG/ML (10ML) SYRINGE FOR IV PUSH (FOR BLOOD PRESSURE SUPPORT)
PREFILLED_SYRINGE | INTRAVENOUS | Status: AC
  Filled 2023-05-18: qty 30

## 2023-05-18 MED ORDER — VASOPRESSIN 20 UNIT/ML IV SOLN
INTRAVENOUS | Status: DC | PRN
Start: 2023-05-18 — End: 2023-05-18
  Administered 2023-05-18: 1 [IU] via INTRAVENOUS

## 2023-05-18 MED ORDER — ALBUMIN HUMAN 5 % IV SOLN
INTRAVENOUS | Status: DC | PRN
Start: 2023-05-18 — End: 2023-05-18

## 2023-05-18 MED ORDER — SODIUM BICARBONATE 8.4 % IV SOLN
INTRAVENOUS | Status: DC | PRN
Start: 2023-05-18 — End: 2023-05-18
  Administered 2023-05-18: 50 meq via INTRAVENOUS

## 2023-05-18 MED ORDER — TETANUS-DIPHTHERIA TOXOIDS TD 5-2 LFU IM INJ
0.5000 mL | INJECTION | Freq: Once | INTRAMUSCULAR | Status: DC
Start: 2023-05-18 — End: 2023-05-18

## 2023-05-18 MED ORDER — LACTATED RINGERS IV SOLN
INTRAVENOUS | Status: DC | PRN
Start: 2023-05-18 — End: 2023-05-18

## 2023-05-18 MED ORDER — CEFAZOLIN SODIUM-DEXTROSE 2-3 GM-%(50ML) IV SOLR
INTRAVENOUS | Status: DC | PRN
Start: 2023-05-18 — End: 2023-05-18
  Administered 2023-05-18: 2 g via INTRAVENOUS

## 2023-05-18 MED ORDER — SODIUM CHLORIDE 0.9 % IV SOLN
INTRAVENOUS | Status: DC | PRN
Start: 2023-05-18 — End: 2023-05-18

## 2023-05-18 MED ORDER — PROPOFOL 10 MG/ML IV BOLUS
INTRAVENOUS | Status: DC | PRN
Start: 2023-05-18 — End: 2023-05-18
  Administered 2023-05-18: 50 mg via INTRAVENOUS
  Administered 2023-05-18: 140 mg via INTRAVENOUS

## 2023-05-18 SURGICAL SUPPLY — 48 items
ADH SKN CLS APL DERMABOND .7 (GAUZE/BANDAGES/DRESSINGS)
APL PRP STRL LF DISP 70% ISPRP (MISCELLANEOUS) ×2
BAG COUNTER SPONGE SURGICOUNT (BAG) ×2 IMPLANT
BAG SPNG CNTER NS LX DISP (BAG) ×2
BLADE CLIPPER SURG (BLADE) IMPLANT
CANISTER SUCT 3000ML PPV (MISCELLANEOUS) IMPLANT
CATH TROCAR 28FR (CATHETERS) IMPLANT
CHLORAPREP W/TINT 26 (MISCELLANEOUS) ×2 IMPLANT
COVER SURGICAL LIGHT HANDLE (MISCELLANEOUS) ×2 IMPLANT
DERMABOND ADVANCED .7 DNX12 (GAUZE/BANDAGES/DRESSINGS) IMPLANT
DRAPE WARM FLUID 44X44 (DRAPES) IMPLANT
DRSG OPSITE POSTOP 4X10 (GAUZE/BANDAGES/DRESSINGS) IMPLANT
DRSG TEGADERM 4X4.5 CHG (GAUZE/BANDAGES/DRESSINGS) IMPLANT
ELECT REM PT RETURN 9FT ADLT (ELECTROSURGICAL) ×2
ELECTRODE REM PT RTRN 9FT ADLT (ELECTROSURGICAL) ×2 IMPLANT
GAUZE SPONGE 4X4 12PLY STRL (GAUZE/BANDAGES/DRESSINGS) IMPLANT
GLOVE BIO SURGEON STRL SZ7 (GLOVE) ×2 IMPLANT
GLOVE BIOGEL PI IND STRL 7.5 (GLOVE) ×2 IMPLANT
GOWN STRL REUS W/ TWL LRG LVL3 (GOWN DISPOSABLE) ×4 IMPLANT
GOWN STRL REUS W/TWL LRG LVL3 (GOWN DISPOSABLE) ×4
GOWN STRL REUS W/TWL XL LVL3 (GOWN DISPOSABLE) ×2 IMPLANT
HEMOSTAT SNOW SURGICEL 2X4 (HEMOSTASIS) IMPLANT
IRRIG SUCT STRYKERFLOW 2 WTIP (MISCELLANEOUS)
IRRIGATION SUCT STRKRFLW 2 WTP (MISCELLANEOUS) IMPLANT
KIT BASIN OR (CUSTOM PROCEDURE TRAY) ×2 IMPLANT
KIT TURNOVER KIT B (KITS) ×2 IMPLANT
NDL INSUFFLATION 14GA 120MM (NEEDLE) ×2 IMPLANT
NEEDLE INSUFFLATION 14GA 120MM (NEEDLE) ×2 IMPLANT
NS IRRIG 1000ML POUR BTL (IV SOLUTION) ×2 IMPLANT
PAD ARMBOARD 7.5X6 YLW CONV (MISCELLANEOUS) ×4 IMPLANT
SCISSORS LAP 5X35 DISP (ENDOMECHANICALS) IMPLANT
SET TUBE SMOKE EVAC HIGH FLOW (TUBING) ×2 IMPLANT
SLEEVE Z-THREAD 5X100MM (TROCAR) ×2 IMPLANT
SPONGE T-LAP 18X18 ~~LOC~~+RFID (SPONGE) IMPLANT
SUT MNCRL AB 4-0 PS2 18 (SUTURE) ×2 IMPLANT
SUT PDS AB 1 CTX 36 (SUTURE) IMPLANT
SUT SILK 2 0 (SUTURE) ×4
SUT SILK 2-0 18XBRD TIE 12 (SUTURE) IMPLANT
SUT VICRYL 0 UR6 27IN ABS (SUTURE) IMPLANT
SYSTEM SAHARA CHEST DRAIN ATS (WOUND CARE) IMPLANT
TOWEL GREEN STERILE (TOWEL DISPOSABLE) ×2 IMPLANT
TOWEL GREEN STERILE FF (TOWEL DISPOSABLE) ×2 IMPLANT
TRAY CHEST TUBE INSERTION DISP (SET/KITS/TRAYS/PACK) IMPLANT
TRAY LAPAROSCOPIC MC (CUSTOM PROCEDURE TRAY) ×2 IMPLANT
TROCAR 11X100 Z THREAD (TROCAR) IMPLANT
TROCAR BALLN 12MMX100 BLUNT (TROCAR) IMPLANT
TROCAR Z-THREAD OPTICAL 5X100M (TROCAR) ×2 IMPLANT
WARMER LAPAROSCOPE (MISCELLANEOUS) ×2 IMPLANT

## 2023-05-18 NOTE — Anesthesia Procedure Notes (Signed)
Arterial Line Insertion Start/End9/21/2024 4:25 PM, 05/18/2023 4:33 PM Performed by: Edmonia Caprio, CRNA, CRNA  Emergency situation Patient sedated Left, radial was placed Catheter size: 20 G Hand hygiene performed , maximum sterile barriers used  and Seldinger technique used  Attempts: 1 Procedure performed without using ultrasound guided technique. Following insertion, dressing applied and Biopatch. Post procedure assessment: normal  Patient tolerated the procedure well with no immediate complications. Additional procedure comments: Inserted immediately after induction of anesthesia.

## 2023-05-18 NOTE — ED Notes (Signed)
HPPD at bedside

## 2023-05-18 NOTE — Anesthesia Postprocedure Evaluation (Signed)
Anesthesia Post Note  Patient: Jeffrey [name]  Procedure(s) Performed: EXPLORATORY LAPAROTOMY CHEST TUBE INSERTION (Right)     Patient location during evaluation: SICU Anesthesia Type: General Level of consciousness: sedated Pain management: pain level controlled Vital Signs Assessment: post-procedure vital signs reviewed and stable Respiratory status: patient remains intubated per anesthesia plan Cardiovascular status: stable Postop Assessment: no apparent nausea or vomiting Anesthetic complications: no  No notable events documented.  Last Vitals:  Vitals:   05/18/23 2015 05/18/23 2030  BP:  94/65  Pulse: 99 98  Resp: 18 18  Temp:    SpO2: 99% 99%    Last Pain:  Vitals:   05/18/23 2000  TempSrc: Axillary  PainSc:                  Shelton Silvas
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
## 2023-05-18 NOTE — ED Notes (Signed)
To CT now
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
## 2023-05-18 NOTE — Anesthesia Preprocedure Evaluation (Addendum)
Anesthesia Evaluation  Patient identified by MRN, date of birth, ID band  Reviewed: Allergy & Precautions, Patient's Chart, lab work & pertinent test results, Unable to perform ROS - Chart review onlyPreop documentation limited or incomplete due to emergent nature of procedure.  Airway Mallampati: Unable to assess       Dental  (+) Dental Advisory Given   Pulmonary neg pulmonary ROS    + decreased breath sounds      Cardiovascular  Rhythm:Regular Rate:Normal     Neuro/Psych negative neurological ROS  negative psych ROS   GI/Hepatic   Endo/Other    Renal/GU      Musculoskeletal   Abdominal   Peds  Hematology   Anesthesia Other Findings   Reproductive/Obstetrics                             Anesthesia Physical Anesthesia Plan  ASA: 4 and emergent  Anesthesia Plan: General   Post-op Pain Management:    Induction: Rapid sequence and Cricoid pressure planned  PONV Risk Score and Plan: 3 and Ondansetron, Treatment may vary due to age or medical condition and Midazolam  Airway Management Planned: Oral ETT  Additional Equipment: Arterial line  Intra-op Plan:   Post-operative Plan: Possible Post-op intubation/ventilation  Informed Consent: I have reviewed the patients History and Physical, chart, labs and discussed the procedure including the risks, benefits and alternatives for the proposed anesthesia with the patient or authorized representative who has indicated his/her understanding and acceptance.       Plan Discussed with: CRNA  Anesthesia Plan Comments:        Anesthesia Quick Evaluation

## 2023-05-18 NOTE — Anesthesia Procedure Notes (Signed)
Procedure Name: Intubation Date/Time: 05/18/2023 4:45 PM  Performed by: Claudina Lick, CRNAPre-anesthesia Checklist: Patient identified, Emergency Drugs available, Suction available and Patient being monitored Patient Re-evaluated:Patient Re-evaluated prior to induction Oxygen Delivery Method: Circle system utilized Preoxygenation: Pre-oxygenation with 100% oxygen Induction Type: IV induction, Rapid sequence and Cricoid Pressure applied Laryngoscope Size: Miller and 2 Grade View: Grade I Tube type: Oral Tube size: 7.5 mm Number of attempts: 1 Airway Equipment and Method: Stylet and Oral airway Placement Confirmation: ETT inserted through vocal cords under direct vision, positive ETCO2 and breath sounds checked- equal and bilateral Secured at: 22 cm Tube secured with: Tape Dental Injury: Teeth and Oropharynx as per pre-operative assessment
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
## 2023-05-18 NOTE — Transfer of Care (Signed)
Immediate Anesthesia Transfer of Care Note  Patient: Jeffrey [name]  Procedure(s) Performed: EXPLORATORY LAPAROTOMY CHEST TUBE INSERTION (Right)  Patient Location: ICU  Anesthesia Type:General  Level of Consciousness: sedated and Patient remains intubated per anesthesia plan  Airway & Oxygen Therapy: Patient remains intubated per anesthesia plan and Patient placed on Ventilator (see vital sign flow sheet for setting)  Post-op Assessment: Report given to RN and Post -op Vital signs reviewed and stable  Post vital signs: Reviewed and stable  Last Vitals:  Vitals Value Taken Time  BP 196/111 05/18/23 1838  Temp    Pulse 111 05/18/23 1846  Resp 16 05/18/23 1846  SpO2 95 % 05/18/23 1846  Vitals shown include unfiled device data.  Last Pain:  Vitals:   05/18/23 1515  TempSrc:   PainSc: 8          Complications: No notable events documented.
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
## 2023-05-18 NOTE — ED Notes (Signed)
Called [employer] about clothes found under patient
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
## 2023-05-19 LAB — BPAM RBC
Blood Product Expiration Date: 202410102359
Blood Product Expiration Date: 202410202359
ISSUE DATE / TIME: 202409211623
ISSUE DATE / TIME: 202409211623
ISSUE DATE / TIME: 202410202359
Unit Type and Rh: 5100
Unit Type and Rh: 5100
Unit Type and Rh: 5100
Unit Type and Rh: 5100
Unit Type and Rh: 5100
Unit Type and Rh: 5100
Unit Type and Rh: 5100
Unit Type and Rh: 5100
Unit Type and Rh: 5100
Unit Type and Rh: 5100
Unit Type and Rh: 5100

## 2023-05-19 LAB — TYPE AND SCREEN
ABO/RH(D): O POS
Antibody Screen: NEGATIVE
Unit division: 0
Unit division: 0
Unit division: 0
Unit division: 0
Unit division: 0
Unit division: 0

## 2023-05-19 LAB — BASIC METABOLIC PANEL
Anion gap: 9 (ref 5–15)
BUN: 9 mg/dL (ref 6–20)
CO2: 22 mmol/L (ref 22–32)
Calcium: 6.7 mg/dL — ABNORMAL LOW (ref 8.9–10.3)
Chloride: 112 mmol/L — ABNORMAL HIGH (ref 98–111)
Creatinine, Ser: 0.92 mg/dL (ref 0.61–1.24)
GFR, Estimated: >60 mL/min (ref >60)
Glucose, Bld: 89 mg/dL (ref 70–99)
Potassium: 3.8 mmol/L (ref 3.5–5.1)
Sodium: 143 mmol/L (ref 135–145)

## 2023-05-19 LAB — PREPARE FRESH FROZEN PLASMA
Unit division: 0
Unit division: 0
Unit division: 0

## 2023-05-19 LAB — COMPREHENSIVE METABOLIC PANEL
ALT: 76 U/L — ABNORMAL HIGH (ref 0–44)
AST: 139 U/L — ABNORMAL HIGH (ref 15–41)
Albumin: 2.7 g/dL — ABNORMAL LOW (ref 3.5–5.0)
Alkaline Phosphatase: 41 U/L (ref 38–126)
Anion gap: 8 (ref 5–15)
BUN: 7 mg/dL (ref 6–20)
CO2: 25 mmol/L (ref 22–32)
Calcium: 8.1 mg/dL — ABNORMAL LOW (ref 8.9–10.3)
Chloride: 108 mmol/L (ref 98–111)
Creatinine, Ser: 0.86 mg/dL (ref 0.61–1.24)
GFR, Estimated: >60 mL/min (ref >60)
Glucose, Bld: 116 mg/dL — ABNORMAL HIGH (ref 70–99)
Potassium: 3.7 mmol/L (ref 3.5–5.1)
Sodium: 141 mmol/L (ref 135–145)
Total Bilirubin: 0.9 mg/dL (ref 0.3–1.2)
Total Protein: 4.6 g/dL — ABNORMAL LOW (ref 6.5–8.1)

## 2023-05-19 LAB — CBC
HCT: 27.2 % — ABNORMAL LOW (ref 39.0–52.0)
Hemoglobin: 9.4 g/dL — ABNORMAL LOW (ref 13.0–17.0)
MCH: 31.3 pg (ref 26.0–34.0)
MCHC: 34.6 g/dL (ref 30.0–36.0)
MCV: 90.7 fL (ref 80.0–100.0)
Platelets: 148 10*3/uL — ABNORMAL LOW (ref 150–400)
RBC: 3 MIL/uL — ABNORMAL LOW (ref 4.22–5.81)
RDW: 15.8 % — ABNORMAL HIGH (ref 11.5–15.5)
WBC: 8.4 10*3/uL (ref 4.0–10.5)
nRBC: 0 % (ref 0.0–0.2)

## 2023-05-19 LAB — BPAM FFP
Blood Product Expiration Date: 202409262359
Blood Product Expiration Date: 202410072359
ISSUE DATE / TIME: 202409211623
ISSUE DATE / TIME: 202409262359
Unit Type and Rh: 6200
Unit Type and Rh: 6200
Unit Type and Rh: 6200
Unit Type and Rh: 6200
Unit Type and Rh: 6200

## 2023-05-19 LAB — TRIGLYCERIDES: Triglycerides: 176 mg/dL — ABNORMAL HIGH (ref <150)

## 2023-05-19 LAB — HEMOGLOBIN AND HEMATOCRIT, BLOOD
HCT: 26.3 % — ABNORMAL LOW (ref 39.0–52.0)
Hemoglobin: 9.1 g/dL — ABNORMAL LOW (ref 13.0–17.0)

## 2023-05-19 MED ORDER — ACETAMINOPHEN 500 MG PO TABS
1000.0000 mg | ORAL_TABLET | Freq: Four times a day (QID) | ORAL | Status: DC
  Administered 2023-05-19 – 2023-05-26 (×25): 1000 mg via ORAL
  Filled 2023-05-19 (×25): qty 2

## 2023-05-19 MED ORDER — SODIUM CHLORIDE 0.9 % IV SOLN
4.0000 g | Freq: Once | INTRAVENOUS | Status: AC
  Administered 2023-05-19: 4 g via INTRAVENOUS
  Filled 2023-05-19: qty 40

## 2023-05-19 MED ORDER — POLYETHYLENE GLYCOL 3350 17 G PO PACK
17.0000 g | PACK | Freq: Every day | ORAL | Status: DC
  Administered 2023-05-20: 17 g via ORAL
  Filled 2023-05-19: qty 1

## 2023-05-19 MED ORDER — HYDROMORPHONE HCL 1 MG/ML IJ SOLN
0.5000 mg | INTRAMUSCULAR | Status: DC | PRN
  Administered 2023-05-20 – 2023-05-24 (×5): 0.5 mg via INTRAVENOUS
  Filled 2023-05-19: qty 1
  Filled 2023-05-19 (×2): qty 0.5
  Filled 2023-05-19 (×2): qty 1

## 2023-05-19 MED ORDER — DOCUSATE SODIUM 50 MG/5ML PO LIQD: 100.0000 mg | Freq: Two times a day (BID) | ORAL | Status: DC

## 2023-05-19 MED ORDER — DOCUSATE SODIUM 100 MG PO CAPS
100.0000 mg | ORAL_CAPSULE | Freq: Two times a day (BID) | ORAL | Status: DC
  Administered 2023-05-19 – 2023-05-26 (×11): 100 mg via ORAL
  Filled 2023-05-19 (×12): qty 1

## 2023-05-19 MED ORDER — METHOCARBAMOL 500 MG PO TABS
500.0000 mg | ORAL_TABLET | Freq: Three times a day (TID) | ORAL | Status: AC
  Administered 2023-05-19 – 2023-05-21 (×5): 500 mg via ORAL
  Filled 2023-05-19 (×5): qty 1

## 2023-05-19 MED ORDER — OXYCODONE HCL 5 MG PO TABS
5.0000 mg | ORAL_TABLET | ORAL | Status: DC | PRN
  Administered 2023-05-19 – 2023-05-20 (×2): 10 mg via ORAL
  Administered 2023-05-20: 5 mg via ORAL
  Administered 2023-05-20 – 2023-05-25 (×13): 10 mg via ORAL
  Filled 2023-05-19 (×11): qty 2
  Filled 2023-05-19: qty 1
  Filled 2023-05-19 (×5): qty 2

## 2023-05-19 MED ORDER — ORAL CARE MOUTH RINSE: 15.0000 mL | OROMUCOSAL | Status: DC | PRN
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
## 2023-05-19 NOTE — Procedures (Signed)
Extubation Procedure Note  Patient Details:   Name: Jeffrey [name] DOB: 1976/05/03 MRN: [id number]   Airway Documentation:  Airway 7.5 mm (Active)  Secured at (cm) 23 cm 05/19/23 1111  Measured From Lips 05/19/23 1111  Secured Location Left 05/19/23 1111  Secured By [employer] 05/19/23 1111  Tube Holder Repositioned Yes 05/19/23 1111  Prone position No 05/19/23 1111  Site Condition Dry 05/19/23 1111   Vent end date: (not recorded) Vent end time: (not recorded)   Evaluation  O2 sats: stable throughout Complications: No apparent complications Patient did tolerate procedure well. Bilateral Breath Sounds: Clear   Yes Pt was successfully extubated with no apparent complications. No audible cuffleak was heard but Vte loss was noted on ventilator. No signs of stridor at this time. Pt is able to cough and clear airway at this time. RT will monitor as needed this point forward.  Jolayne Panther 05/19/2023, 1:17 PM

## 2023-05-19 NOTE — Plan of Care (Signed)
  Problem: Education: Goal: Knowledge of General Education information will improve Description Including pain rating scale, medication(s)/side effects and non-pharmacologic comfort measures Outcome: Progressing   Problem: Health Behavior/Discharge Planning: Goal: Ability to manage health-related needs will improve Outcome: Progressing   

## 2023-05-19 NOTE — Plan of Care (Signed)

## 2023-05-19 NOTE — Progress Notes (Signed)
Patient weaned to PSV 5/5 with good tidal volumes. Awake and following commands. Extubated to nasal cannula.

## 2023-05-19 NOTE — Progress Notes (Signed)
[facility] [employer] request that we call them when the patient is about to be discharged.   [phone]

## 2023-05-19 NOTE — Progress Notes (Signed)
   05/19/23 0735  Daily Weaning Assessment  Daily Assessment of Readiness to Wean Wean protocol criteria met (SBT performed)  SBT Method CPAP 5 cm H20 and PS 5 cm H20 (Pressure support 8)  Weaning Start Time 0735   Pt was placed on wean on 8/5 40% but did not tolerate due to low Mve. Pt was placed back on full support due to apnea. Respiratory care ongoing. RN aware.

## 2023-05-19 NOTE — TOC CAGE-AID Note (Signed)
Transition of Care [facility]) - CAGE-AID Screening   Patient Details  Name: Jeffrey [name] MRN: [id number] Date of Birth: March 02, 1976  [facility] of [facility]) CM/SW Contact:    [name], RN Phone Number: [phone] 05/19/2023, 4:02 PM   Clinical Narrative: Pt intubated, screening unable to be completed.    CAGE-AID Screening: Substance Abuse Screening unable to be completed due to: : Patient unable to participate

## 2023-05-19 NOTE — Progress Notes (Addendum)
Patient states he has a mom -Mae Oldwick and Sister - Caydyn Fieser that he would like to be notified that he is in the hospital. The number for the mom in the chart is actually the patients old cell number and not hers.   Patient can only remember his bother in law, Josh's number off the top of his head.   Josh [phone] was called twice. No voicemail left due to mailbox not being set up yet.

## 2023-05-20 ENCOUNTER — Encounter (HOSPITAL_COMMUNITY): Payer: Self-pay | Admitting: General Surgery

## 2023-05-20 ENCOUNTER — Inpatient Hospital Stay (HOSPITAL_COMMUNITY): Payer: BC Managed Care – PPO

## 2023-05-20 LAB — CBC
HCT: 25.7 % — ABNORMAL LOW (ref 39.0–52.0)
HCT: 25.1 % — ABNORMAL LOW (ref 39.0–52.0)
Hemoglobin: 8.7 g/dL — ABNORMAL LOW (ref 13.0–17.0)
Hemoglobin: 8.6 g/dL — ABNORMAL LOW (ref 13.0–17.0)
MCH: 30.2 pg (ref 26.0–34.0)
MCH: 30.4 pg (ref 26.0–34.0)
MCHC: 33.9 g/dL (ref 30.0–36.0)
MCHC: 34.3 g/dL (ref 30.0–36.0)
MCV: 89.2 fL (ref 80.0–100.0)
MCV: 88.7 fL (ref 80.0–100.0)
Platelets: 149 10*3/uL — ABNORMAL LOW (ref 150–400)
Platelets: 161 10*3/uL (ref 150–400)
RBC: 2.88 MIL/uL — ABNORMAL LOW (ref 4.22–5.81)
RBC: 2.83 MIL/uL — ABNORMAL LOW (ref 4.22–5.81)
RDW: 14.8 % (ref 11.5–15.5)
RDW: 14.6 % (ref 11.5–15.5)
WBC: 10.9 10*3/uL — ABNORMAL HIGH (ref 4.0–10.5)
WBC: 10 10*3/uL (ref 4.0–10.5)
nRBC: 0 % (ref 0.0–0.2)
nRBC: 0 % (ref 0.0–0.2)

## 2023-05-20 MED ORDER — MAGNESIUM CITRATE PO SOLN
1.0000 | Freq: Once | ORAL | Status: DC
  Filled 2023-05-20: qty 296

## 2023-05-20 MED ORDER — POLYETHYLENE GLYCOL 3350 17 G PO PACK
17.0000 g | PACK | Freq: Two times a day (BID) | ORAL | Status: DC
  Administered 2023-05-20 – 2023-05-25 (×4): 17 g via ORAL
  Filled 2023-05-20 (×10): qty 1

## 2023-05-20 MED ORDER — MAGNESIUM HYDROXIDE 400 MG/5ML PO SUSP
30.0000 mL | Freq: Once | ORAL | Status: AC
  Administered 2023-05-20: 30 mL via ORAL
  Filled 2023-05-20: qty 30

## 2023-05-20 MED ORDER — BISACODYL 5 MG PO TBEC
10.0000 mg | DELAYED_RELEASE_TABLET | Freq: Once | ORAL | Status: AC
  Administered 2023-05-20: 10 mg via ORAL
  Filled 2023-05-20: qty 2

## 2023-05-20 MED ORDER — SENNA 8.6 MG PO TABS
2.0000 | ORAL_TABLET | Freq: Once | ORAL | Status: AC
  Administered 2023-05-20: 17.2 mg via ORAL
  Filled 2023-05-20: qty 2

## 2023-05-20 MED ORDER — BISACODYL 10 MG RE SUPP: 10.0000 mg | Freq: Once | RECTAL | Status: DC
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
## 2023-05-21 ENCOUNTER — Inpatient Hospital Stay (HOSPITAL_COMMUNITY): Payer: BC Managed Care – PPO

## 2023-05-21 LAB — CBC
HCT: 26.1 % — ABNORMAL LOW (ref 39.0–52.0)
Hemoglobin: 9.1 g/dL — ABNORMAL LOW (ref 13.0–17.0)
MCH: 31.1 pg (ref 26.0–34.0)
MCHC: 34.9 g/dL (ref 30.0–36.0)
MCV: 89.1 fL (ref 80.0–100.0)
Platelets: 172 10*3/uL (ref 150–400)
RBC: 2.93 MIL/uL — ABNORMAL LOW (ref 4.22–5.81)
RDW: 14.2 % (ref 11.5–15.5)
WBC: 10.3 10*3/uL (ref 4.0–10.5)
nRBC: 0 % (ref 0.0–0.2)

## 2023-05-21 MED ORDER — KETOROLAC TROMETHAMINE 15 MG/ML IJ SOLN
30.0000 mg | Freq: Four times a day (QID) | INTRAMUSCULAR | Status: AC
  Administered 2023-05-21 – 2023-05-26 (×20): 30 mg via INTRAVENOUS
  Filled 2023-05-21 (×20): qty 2

## 2023-05-21 MED ORDER — ALUM & MAG HYDROXIDE-SIMETH 200-200-20 MG/5ML PO SUSP
30.0000 mL | Freq: Four times a day (QID) | ORAL | Status: DC | PRN
  Administered 2023-05-21: 30 mL via ORAL
  Filled 2023-05-21: qty 30

## 2023-05-21 MED ORDER — METHOCARBAMOL 500 MG PO TABS
500.0000 mg | ORAL_TABLET | Freq: Four times a day (QID) | ORAL | Status: AC
  Administered 2023-05-21 – 2023-05-22 (×5): 500 mg via ORAL
  Filled 2023-05-21 (×5): qty 1

## 2023-05-21 NOTE — Progress Notes (Signed)
Chest tube drainage marked at 950 for a total of 40 ml of output during shift.
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
## 2023-05-22 ENCOUNTER — Encounter (HOSPITAL_COMMUNITY): Payer: Self-pay

## 2023-05-22 ENCOUNTER — Inpatient Hospital Stay (HOSPITAL_COMMUNITY): Payer: BC Managed Care – PPO

## 2023-05-22 DIAGNOSIS — T1490XA Injury, unspecified, initial encounter: Secondary | ICD-10-CM

## 2023-05-22 LAB — CBC
HCT: 25.4 % — ABNORMAL LOW (ref 39.0–52.0)
Hemoglobin: 8.8 g/dL — ABNORMAL LOW (ref 13.0–17.0)
MCH: 31.2 pg (ref 26.0–34.0)
MCHC: 34.6 g/dL (ref 30.0–36.0)
MCV: 90.1 fL (ref 80.0–100.0)
Platelets: 222 10*3/uL (ref 150–400)
RBC: 2.82 MIL/uL — ABNORMAL LOW (ref 4.22–5.81)
RDW: 13.9 % (ref 11.5–15.5)
WBC: 8.6 10*3/uL (ref 4.0–10.5)
nRBC: 0 % (ref 0.0–0.2)

## 2023-05-22 LAB — TRIGLYCERIDES: Triglycerides: 92 mg/dL (ref <150)

## 2023-05-22 MED ORDER — ADULT MULTIVITAMIN W/MINERALS CH
1.0000 | ORAL_TABLET | Freq: Every day | ORAL | Status: DC
  Administered 2023-05-22 – 2023-05-26 (×5): 1 via ORAL
  Filled 2023-05-22 (×5): qty 1

## 2023-05-22 MED ORDER — SERTRALINE HCL 50 MG PO TABS
50.0000 mg | ORAL_TABLET | Freq: Every day | ORAL | Status: DC
  Administered 2023-05-22 – 2023-05-23 (×2): 50 mg via ORAL
  Filled 2023-05-22 (×2): qty 1

## 2023-05-22 MED ORDER — ENOXAPARIN SODIUM 30 MG/0.3ML IJ SOSY
30.0000 mg | PREFILLED_SYRINGE | Freq: Two times a day (BID) | INTRAMUSCULAR | Status: DC
  Administered 2023-05-22 – 2023-05-25 (×8): 30 mg via SUBCUTANEOUS
  Filled 2023-05-22 (×8): qty 0.3

## 2023-05-22 MED ORDER — LORAZEPAM 1 MG PO TABS: 1.0000 mg | ORAL_TABLET | ORAL | Status: AC | PRN

## 2023-05-22 MED ORDER — LORAZEPAM 2 MG/ML IJ SOLN: 1.0000 mg | INTRAMUSCULAR | Status: AC | PRN

## 2023-05-22 MED ORDER — THIAMINE MONONITRATE 100 MG PO TABS
100.0000 mg | ORAL_TABLET | Freq: Every day | ORAL | Status: DC
  Administered 2023-05-22 – 2023-05-26 (×5): 100 mg via ORAL
  Filled 2023-05-22 (×5): qty 1
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
## 2023-05-22 NOTE — Plan of Care (Signed)
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
## 2023-05-23 LAB — CBC
HCT: 26.3 % — ABNORMAL LOW (ref 39.0–52.0)
Hemoglobin: 9 g/dL — ABNORMAL LOW (ref 13.0–17.0)
MCH: 30.3 pg (ref 26.0–34.0)
MCHC: 34.2 g/dL (ref 30.0–36.0)
MCV: 88.6 fL (ref 80.0–100.0)
Platelets: 303 10*3/uL (ref 150–400)
RBC: 2.97 MIL/uL — ABNORMAL LOW (ref 4.22–5.81)
RDW: 13.8 % (ref 11.5–15.5)
WBC: 11 10*3/uL — ABNORMAL HIGH (ref 4.0–10.5)
nRBC: 0 % (ref 0.0–0.2)

## 2023-05-23 LAB — HEMOGLOBIN A1C
Hgb A1c MFr Bld: 5.1 % (ref 4.8–5.6)
Mean Plasma Glucose: 99.67 mg/dL

## 2023-05-23 MED ORDER — SERTRALINE HCL 100 MG PO TABS
100.0000 mg | ORAL_TABLET | Freq: Every day | ORAL | Status: DC
  Administered 2023-05-24 – 2023-05-26 (×3): 100 mg via ORAL
  Filled 2023-05-23 (×3): qty 1

## 2023-05-23 MED ORDER — SERTRALINE HCL 50 MG PO TABS
50.0000 mg | ORAL_TABLET | Freq: Once | ORAL | Status: AC
  Administered 2023-05-23: 50 mg via ORAL
  Filled 2023-05-23: qty 1
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
## 2023-05-24 ENCOUNTER — Inpatient Hospital Stay (HOSPITAL_COMMUNITY): Payer: BC Managed Care – PPO

## 2023-05-24 NOTE — Plan of Care (Signed)

## 2023-05-24 NOTE — Discharge Instructions (Signed)
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
## 2023-05-25 ENCOUNTER — Inpatient Hospital Stay (HOSPITAL_COMMUNITY): Payer: BC Managed Care – PPO

## 2023-05-25 LAB — TRIGLYCERIDES: Triglycerides: 72 mg/dL (ref <150)

## 2023-05-25 MED ORDER — METHOCARBAMOL 500 MG PO TABS
500.0000 mg | ORAL_TABLET | Freq: Three times a day (TID) | ORAL | Status: DC
  Administered 2023-05-25 – 2023-05-26 (×4): 500 mg via ORAL
  Filled 2023-05-25 (×4): qty 1

## 2023-05-25 NOTE — Progress Notes (Signed)
Tele order for pt D/C per Randon Goldsmith, MD.
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
## 2023-05-25 NOTE — Plan of Care (Signed)
Patient is AOX4. Able to use call bell appropriately. Surgical incisions to abdomen and right chest with chest tube in place to [facility]. No leaks noted. No new drainage noted to sites. Dressings changed this shift. RA. VSS. Early this shift, patient c/o right shoulder cramping that radiated to right chest and abdomen. Provider made aware with no new orders to this RN. Pain subsided without intervention. Patient states he is better. No further complaints.   Problem: Education: Goal: Knowledge of General Education information will improve Description: Including pain rating scale, medication(s)/side effects and non-pharmacologic comfort measures Outcome: Progressing   Problem: Health Behavior/Discharge Planning: Goal: Ability to manage health-related needs will improve Outcome: Progressing   Problem: Clinical Measurements: Goal: Ability to maintain clinical measurements within normal limits will improve Outcome: Progressing Goal: Will remain free from infection Outcome: Progressing Goal: Diagnostic test results will improve Outcome: Progressing Goal: Respiratory complications will improve Outcome: Progressing Goal: Cardiovascular complication will be avoided Outcome: Progressing   Problem: Activity: Goal: Risk for activity intolerance will decrease Outcome: Progressing   Problem: Nutrition: Goal: Adequate nutrition will be maintained Outcome: Progressing   Problem: Coping: Goal: Level of anxiety will decrease Outcome: Progressing   Problem: Elimination: Goal: Will not experience complications related to bowel motility Outcome: Progressing Goal: Will not experience complications related to urinary retention Outcome: Progressing   Problem: Pain Managment: Goal: General experience of comfort will improve Outcome: Progressing   Problem: Safety: Goal: Ability to remain free from injury will improve Outcome: Progressing   Problem: Skin Integrity: Goal: Risk for impaired skin  integrity will decrease Outcome: Progressing   Problem: Safety: Goal: Non-violent Restraint(s) Outcome: Progressing

## 2023-05-26 ENCOUNTER — Inpatient Hospital Stay (HOSPITAL_COMMUNITY): Payer: BC Managed Care – PPO

## 2023-05-26 MED ORDER — OXYCODONE HCL 5 MG PO TABS: 5.0000 mg | ORAL_TABLET | ORAL | 0 refills | Status: AC | PRN

## 2023-05-26 MED ORDER — POLYETHYLENE GLYCOL 3350 17 G PO PACK: 17.0000 g | PACK | Freq: Every day | ORAL | Status: AC | PRN

## 2023-05-26 MED ORDER — SERTRALINE HCL 100 MG PO TABS: 100.0000 mg | ORAL_TABLET | Freq: Every day | ORAL | 0 refills | Status: AC

## 2023-05-26 MED ORDER — METHOCARBAMOL 500 MG PO TABS: 500.0000 mg | ORAL_TABLET | Freq: Three times a day (TID) | ORAL | 0 refills | Status: AC | PRN

## 2023-05-26 MED ORDER — ACETAMINOPHEN 500 MG PO TABS: 1000.0000 mg | ORAL_TABLET | Freq: Four times a day (QID) | ORAL | Status: AC | PRN
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
## 2023-05-26 NOTE — Plan of Care (Signed)
  Problem: Education: Goal: Knowledge of General Education information will improve Description: Including pain rating scale, medication(s)/side effects and non-pharmacologic comfort measures 05/26/2023 1202 by Darden Amber, RN Outcome: Adequate for Discharge 05/26/2023 0757 by Darden Amber, RN Outcome: Progressing   Problem: Health Behavior/Discharge Planning: Goal: Ability to manage health-related needs will improve 05/26/2023 1202 by Darden Amber, RN Outcome: Adequate for Discharge 05/26/2023 0757 by Darden Amber, RN Outcome: Progressing   Problem: Clinical Measurements: Goal: Ability to maintain clinical measurements within normal limits will improve 05/26/2023 1202 by Darden Amber, RN Outcome: Adequate for Discharge 05/26/2023 0757 by Darden Amber, RN Outcome: Progressing Goal: Will remain free from infection 05/26/2023 1202 by Darden Amber, RN Outcome: Adequate for Discharge 05/26/2023 0757 by Darden Amber, RN Outcome: Progressing Goal: Diagnostic test results will improve 05/26/2023 1202 by Darden Amber, RN Outcome: Adequate for Discharge 05/26/2023 0757 by Darden Amber, RN Outcome: Progressing Goal: Respiratory complications will improve 05/26/2023 1202 by Darden Amber, RN Outcome: Adequate for Discharge 05/26/2023 0757 by Darden Amber, RN Outcome: Progressing Goal: Cardiovascular complication will be avoided 05/26/2023 1202 by Darden Amber, RN Outcome: Adequate for Discharge 05/26/2023 0757 by Darden Amber, RN Outcome: Progressing   Problem: Activity: Goal: Risk for activity intolerance will decrease 05/26/2023 1202 by Darden Amber, RN Outcome: Adequate for Discharge 05/26/2023 0757 by Darden Amber, RN Outcome: Progressing   Problem: Nutrition: Goal: Adequate nutrition will be maintained 05/26/2023 1202 by Darden Amber, RN Outcome: Adequate for Discharge 05/26/2023 0757 by  Darden Amber, RN Outcome: Progressing   Problem: Coping: Goal: Level of anxiety will decrease 05/26/2023 1202 by Darden Amber, RN Outcome: Adequate for Discharge 05/26/2023 0757 by Darden Amber, RN Outcome: Progressing   Problem: Elimination: Goal: Will not experience complications related to bowel motility 05/26/2023 1202 by Darden Amber, RN Outcome: Adequate for Discharge 05/26/2023 0757 by Darden Amber, RN Outcome: Progressing Goal: Will not experience complications related to urinary retention 05/26/2023 1202 by Darden Amber, RN Outcome: Adequate for Discharge 05/26/2023 0757 by Darden Amber, RN Outcome: Progressing   Problem: Pain Managment: Goal: General experience of comfort will improve 05/26/2023 1202 by Darden Amber, RN Outcome: Adequate for Discharge 05/26/2023 0757 by Darden Amber, RN Outcome: Progressing   Problem: Safety: Goal: Ability to remain free from injury will improve 05/26/2023 1202 by Darden Amber, RN Outcome: Adequate for Discharge 05/26/2023 0757 by Darden Amber, RN Outcome: Progressing   Problem: Skin Integrity: Goal: Risk for impaired skin integrity will decrease 05/26/2023 1202 by Darden Amber, RN Outcome: Adequate for Discharge 05/26/2023 0757 by Darden Amber, RN Outcome: Progressing   Problem: Safety: Goal: Non-violent Restraint(s) 05/26/2023 1202 by Darden Amber, RN Outcome: Adequate for Discharge 05/26/2023 0757 by Darden Amber, RN Outcome: Progressing
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
## 2023-06-03 ENCOUNTER — Ambulatory Visit
Admission: RE | Admit: 2023-06-03 | Discharge: 2023-06-03 | Disposition: A | Discharge status: Home / Self Care | Payer: BC Managed Care – PPO | Source: Ambulatory Visit | Attending: General Surgery | Admitting: General Surgery

## 2023-06-03 ENCOUNTER — Other Ambulatory Visit (HOSPITAL_COMMUNITY): Payer: Self-pay | Admitting: General Surgery

## 2023-06-03 DIAGNOSIS — J9811 Atelectasis: Secondary | ICD-10-CM | POA: Diagnosis not present

## 2023-06-03 DIAGNOSIS — J90 Pleural effusion, not elsewhere classified: Secondary | ICD-10-CM | POA: Diagnosis not present

## 2023-06-03 DIAGNOSIS — J942 Hemothorax: Secondary | ICD-10-CM

## 2023-06-03 DIAGNOSIS — S21339A Puncture wound without foreign body of unspecified front wall of thorax with penetration into thoracic cavity, initial encounter: Secondary | ICD-10-CM

## 2023-06-04 ENCOUNTER — Encounter: Payer: Self-pay | Admitting: General Surgery

## 2023-06-04 NOTE — Progress Notes (Signed)
Patient's chest x-ray from yesterday reviewed and appears overall stable since discharge.  I have tried to contact the patient via his phone number, both of his mother's, as well as his sister's phone numbers.  None have been successful.  I have contacted our office to inform them that if he were to call for his results, they can let him know his imaging is stable and no further follow up is warranted at this time.  Letha Cape 11:03 AM 06/04/2023
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
## 2023-06-16 ENCOUNTER — Encounter (HOSPITAL_COMMUNITY): Payer: Self-pay | Admitting: *Deleted

## 2023-06-16 ENCOUNTER — Emergency Department (HOSPITAL_COMMUNITY): Payer: BC Managed Care – PPO

## 2023-06-16 ENCOUNTER — Emergency Department (HOSPITAL_COMMUNITY)
Admission: EM | Admit: 2023-06-16 | Discharge: 2023-06-16 | Disposition: A | Discharge status: Home / Self Care | Payer: BC Managed Care – PPO | Attending: Emergency Medicine | Admitting: Emergency Medicine

## 2023-06-16 ENCOUNTER — Other Ambulatory Visit: Payer: Self-pay

## 2023-06-16 DIAGNOSIS — R1084 Generalized abdominal pain: Secondary | ICD-10-CM | POA: Diagnosis not present

## 2023-06-16 DIAGNOSIS — F10220 Alcohol dependence with intoxication, uncomplicated: Secondary | ICD-10-CM | POA: Insufficient documentation

## 2023-06-16 DIAGNOSIS — I959 Hypotension, unspecified: Secondary | ICD-10-CM | POA: Diagnosis not present

## 2023-06-16 DIAGNOSIS — F10920 Alcohol use, unspecified with intoxication, uncomplicated: Secondary | ICD-10-CM

## 2023-06-16 DIAGNOSIS — R1013 Epigastric pain: Secondary | ICD-10-CM | POA: Diagnosis not present

## 2023-06-16 DIAGNOSIS — R55 Syncope and collapse: Secondary | ICD-10-CM | POA: Diagnosis not present

## 2023-06-16 DIAGNOSIS — Y906 Blood alcohol level of 120-199 mg/100 ml: Secondary | ICD-10-CM | POA: Diagnosis not present

## 2023-06-16 DIAGNOSIS — R079 Chest pain, unspecified: Secondary | ICD-10-CM | POA: Diagnosis not present

## 2023-06-16 DIAGNOSIS — R918 Other nonspecific abnormal finding of lung field: Secondary | ICD-10-CM | POA: Diagnosis not present

## 2023-06-16 DIAGNOSIS — R0789 Other chest pain: Secondary | ICD-10-CM | POA: Insufficient documentation

## 2023-06-16 DIAGNOSIS — F10129 Alcohol abuse with intoxication, unspecified: Secondary | ICD-10-CM | POA: Diagnosis not present

## 2023-06-16 DIAGNOSIS — J90 Pleural effusion, not elsewhere classified: Secondary | ICD-10-CM | POA: Diagnosis not present

## 2023-06-16 LAB — CBC WITH DIFFERENTIAL/PLATELET
Abs Immature Granulocytes: 0.04 10*3/uL (ref 0.00–0.07)
Basophils Absolute: 0 10*3/uL (ref 0.0–0.1)
Basophils Relative: 1 %
Eosinophils Absolute: 0.1 10*3/uL (ref 0.0–0.5)
Eosinophils Relative: 1 %
HCT: 33.5 % — ABNORMAL LOW (ref 39.0–52.0)
Hemoglobin: 10.8 g/dL — ABNORMAL LOW (ref 13.0–17.0)
Immature Granulocytes: 1 %
Lymphocytes Relative: 23 %
Lymphs Abs: 2 10*3/uL (ref 0.7–4.0)
MCH: 28.1 pg (ref 26.0–34.0)
MCHC: 32.2 g/dL (ref 30.0–36.0)
MCV: 87.2 fL (ref 80.0–100.0)
Monocytes Absolute: 0.7 10*3/uL (ref 0.1–1.0)
Monocytes Relative: 8 %
Neutro Abs: 5.9 10*3/uL (ref 1.7–7.7)
Neutrophils Relative %: 66 %
Platelets: 596 10*3/uL — ABNORMAL HIGH (ref 150–400)
RBC: 3.84 MIL/uL — ABNORMAL LOW (ref 4.22–5.81)
RDW: 16.3 % — ABNORMAL HIGH (ref 11.5–15.5)
WBC: 8.8 10*3/uL (ref 4.0–10.5)
nRBC: 0 % (ref 0.0–0.2)

## 2023-06-16 LAB — COMPREHENSIVE METABOLIC PANEL
ALT: 54 U/L — ABNORMAL HIGH (ref 0–44)
AST: 35 U/L (ref 15–41)
Albumin: 3.2 g/dL — ABNORMAL LOW (ref 3.5–5.0)
Alkaline Phosphatase: 172 U/L — ABNORMAL HIGH (ref 38–126)
Anion gap: 15 (ref 5–15)
BUN: 7 mg/dL (ref 6–20)
CO2: 20 mmol/L — ABNORMAL LOW (ref 22–32)
Calcium: 8.9 mg/dL (ref 8.9–10.3)
Chloride: 102 mmol/L (ref 98–111)
Creatinine, Ser: 1.18 mg/dL (ref 0.61–1.24)
GFR, Estimated: >60 mL/min (ref >60)
Glucose, Bld: 90 mg/dL (ref 70–99)
Potassium: 3.5 mmol/L (ref 3.5–5.1)
Sodium: 137 mmol/L (ref 135–145)
Total Bilirubin: 0.3 mg/dL (ref 0.3–1.2)
Total Protein: 7.1 g/dL (ref 6.5–8.1)

## 2023-06-16 LAB — I-STAT CHEM 8, ED
BUN: 8 mg/dL (ref 6–20)
Calcium, Ion: 0.99 mmol/L — ABNORMAL LOW (ref 1.15–1.40)
Chloride: 101 mmol/L (ref 98–111)
Creatinine, Ser: 1.5 mg/dL — ABNORMAL HIGH (ref 0.61–1.24)
Glucose, Bld: 86 mg/dL (ref 70–99)
HCT: 35 % — ABNORMAL LOW (ref 39.0–52.0)
Hemoglobin: 11.9 g/dL — ABNORMAL LOW (ref 13.0–17.0)
Potassium: 4.5 mmol/L (ref 3.5–5.1)
Sodium: 137 mmol/L (ref 135–145)
TCO2: 23 mmol/L (ref 22–32)

## 2023-06-16 LAB — I-STAT CG4 LACTIC ACID, ED
Lactic Acid, Venous: 3.9 mmol/L (ref 0.5–1.9)
Lactic Acid, Venous: 1.9 mmol/L (ref 0.5–1.9)

## 2023-06-16 LAB — LIPASE, BLOOD: Lipase: 41 U/L (ref 11–51)

## 2023-06-16 LAB — TROPONIN I (HIGH SENSITIVITY)
Troponin I (High Sensitivity): 4 ng/L (ref <18)
Troponin I (High Sensitivity): 4 ng/L (ref <18)

## 2023-06-16 LAB — ETHANOL: Alcohol, Ethyl (B): 188 mg/dL — ABNORMAL HIGH (ref <10)

## 2023-06-16 LAB — PROTIME-INR
INR: 1.1 (ref 0.8–1.2)
Prothrombin Time: 14.3 s (ref 11.4–15.2)

## 2023-06-16 MED ORDER — IOHEXOL 350 MG/ML SOLN
75.0000 mL | Freq: Once | INTRAVENOUS | Status: AC | PRN
  Administered 2023-06-16: 75 mL via INTRAVENOUS

## 2023-06-16 MED ORDER — SODIUM CHLORIDE 0.9 % IV SOLN
1000.0000 mL | INTRAVENOUS | Status: DC
  Administered 2023-06-16: 1000 mL via INTRAVENOUS

## 2023-06-16 MED ORDER — SODIUM CHLORIDE 0.9 % IV BOLUS (SEPSIS)
1000.0000 mL | Freq: Once | INTRAVENOUS | Status: AC
  Administered 2023-06-16: 1000 mL via INTRAVENOUS
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
## 2023-06-21 LAB — CULTURE, BLOOD (ROUTINE X 2)
Culture: NO GROWTH
Culture: NO GROWTH
Special Requests: ADEQUATE
Special Requests: ADEQUATE

## 2023-07-29 ENCOUNTER — Ambulatory Visit: Payer: BC Managed Care – PPO | Admitting: Family Medicine

## 2023-07-29 ENCOUNTER — Encounter: Payer: Self-pay | Admitting: Family Medicine

## 2023-07-29 VITALS — BP 138/84 | HR 99 | Temp 98.7°F | Resp 18 | Ht 68.0 in | Wt 142.8 lb

## 2023-07-29 DIAGNOSIS — F17200 Nicotine dependence, unspecified, uncomplicated: Secondary | ICD-10-CM | POA: Diagnosis not present

## 2023-07-29 DIAGNOSIS — F419 Anxiety disorder, unspecified: Secondary | ICD-10-CM | POA: Diagnosis not present

## 2023-07-29 DIAGNOSIS — F32A Depression, unspecified: Secondary | ICD-10-CM

## 2023-07-29 DIAGNOSIS — Z7689 Persons encountering health services in other specified circumstances: Secondary | ICD-10-CM

## 2023-07-29 MED ORDER — BUPROPION HCL ER (XL) 150 MG PO TB24: 150.0000 mg | ORAL_TABLET | Freq: Every day | ORAL | 0 refills | Status: AC
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
## 2023-09-04 ENCOUNTER — Ambulatory Visit: Payer: BC Managed Care – PPO | Admitting: Family Medicine
